# Patient Record
Sex: Male | Born: 1955 | Race: White | Hispanic: No | Marital: Single | Smoking: Current every day smoker
Health system: Southern US, Community
[De-identification: ages and names within clinical notes are randomized; demographics above are authoritative.]

## PROBLEM LIST (undated history)

## (undated) DIAGNOSIS — J439 Emphysema, unspecified: Secondary | ICD-10-CM

## (undated) DIAGNOSIS — J449 Chronic obstructive pulmonary disease, unspecified: Secondary | ICD-10-CM

## (undated) DIAGNOSIS — I319 Disease of pericardium, unspecified: Secondary | ICD-10-CM

## (undated) DIAGNOSIS — F319 Bipolar disorder, unspecified: Secondary | ICD-10-CM

## (undated) DIAGNOSIS — I10 Essential (primary) hypertension: Secondary | ICD-10-CM

## (undated) DIAGNOSIS — F4310 Post-traumatic stress disorder, unspecified: Secondary | ICD-10-CM

## (undated) HISTORY — PX: FRACTURE SURGERY: SHX138

## (undated) HISTORY — PX: OTHER SURGICAL HISTORY: SHX169

## (undated) HISTORY — DX: Chronic obstructive pulmonary disease, unspecified: J44.9

## (undated) HISTORY — DX: Emphysema, unspecified: J43.9

## (undated) HISTORY — DX: Disease of pericardium, unspecified: I31.9

---

## 2013-03-22 ENCOUNTER — Ambulatory Visit: Payer: Self-pay

## 2013-08-06 ENCOUNTER — Ambulatory Visit: Payer: Self-pay

## 2018-04-24 ENCOUNTER — Encounter: Payer: Self-pay | Admitting: Emergency Medicine

## 2018-04-24 ENCOUNTER — Other Ambulatory Visit: Payer: Self-pay

## 2018-04-24 ENCOUNTER — Emergency Department
Admission: EM | Admit: 2018-04-24 | Discharge: 2018-04-24 | Disposition: A | Discharge status: Home / Self Care | Payer: Medicaid Other | Attending: Emergency Medicine | Admitting: Emergency Medicine

## 2018-04-24 ENCOUNTER — Emergency Department: Payer: Medicaid Other

## 2018-04-24 DIAGNOSIS — I10 Essential (primary) hypertension: Secondary | ICD-10-CM | POA: Insufficient documentation

## 2018-04-24 DIAGNOSIS — M75101 Unspecified rotator cuff tear or rupture of right shoulder, not specified as traumatic: Secondary | ICD-10-CM | POA: Insufficient documentation

## 2018-04-24 DIAGNOSIS — Z87891 Personal history of nicotine dependence: Secondary | ICD-10-CM | POA: Insufficient documentation

## 2018-04-24 DIAGNOSIS — M7581 Other shoulder lesions, right shoulder: Secondary | ICD-10-CM

## 2018-04-24 DIAGNOSIS — M25511 Pain in right shoulder: Secondary | ICD-10-CM | POA: Diagnosis present

## 2018-04-24 HISTORY — DX: Bipolar disorder, unspecified: F31.9

## 2018-04-24 HISTORY — DX: Essential (primary) hypertension: I10

## 2018-04-24 HISTORY — DX: Post-traumatic stress disorder, unspecified: F43.10

## 2018-04-24 MED ORDER — HYDROMORPHONE HCL 1 MG/ML IJ SOLN
1.0000 mg | Freq: Once | INTRAMUSCULAR | Status: AC
  Administered 2018-04-24: 1 mg via INTRAMUSCULAR
  Filled 2018-04-24: qty 1

## 2018-04-24 MED ORDER — NAPROXEN 500 MG PO TABS: 500.0000 mg | ORAL_TABLET | Freq: Two times a day (BID) | ORAL | Status: DC

## 2018-04-24 MED ORDER — ORPHENADRINE CITRATE 30 MG/ML IJ SOLN
60.0000 mg | Freq: Two times a day (BID) | INTRAMUSCULAR | Status: DC
  Administered 2018-04-24: 60 mg via INTRAMUSCULAR
  Filled 2018-04-24 (×2): qty 2

## 2018-04-24 MED ORDER — OXYCODONE-ACETAMINOPHEN 7.5-325 MG PO TABS: 1.0000 | ORAL_TABLET | Freq: Four times a day (QID) | ORAL | 0 refills | Status: DC | PRN

## 2018-04-24 NOTE — ED Triage Notes (Signed)
Seen at [facility] for muscle spasm in upper back. Now it is radiating under right arm. It iincreases with movement of arm.  Started 2 week ago.  "constant' pain.
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
## 2018-04-24 NOTE — ED Notes (Signed)
Pt ambulatory to POV without difficulty. VSS, NAD. Discharge instructions, RX and follow up discussed. All questions addressed.

## 2018-04-24 NOTE — Discharge Instructions (Addendum)
Wear arm sling until evaluation by orthopedics. °

## 2018-05-25 ENCOUNTER — Other Ambulatory Visit: Payer: Self-pay | Admitting: Student

## 2018-05-25 DIAGNOSIS — M4722 Other spondylosis with radiculopathy, cervical region: Secondary | ICD-10-CM

## 2018-06-06 ENCOUNTER — Ambulatory Visit
Admission: RE | Admit: 2018-06-06 | Discharge: 2018-06-06 | Disposition: A | Discharge status: Home / Self Care | Payer: Medicaid Other | Source: Ambulatory Visit | Attending: Student | Admitting: Student

## 2018-06-06 DIAGNOSIS — M4802 Spinal stenosis, cervical region: Secondary | ICD-10-CM | POA: Insufficient documentation

## 2018-06-06 DIAGNOSIS — M4722 Other spondylosis with radiculopathy, cervical region: Secondary | ICD-10-CM | POA: Diagnosis present

## 2018-06-06 DIAGNOSIS — M50322 Other cervical disc degeneration at C5-C6 level: Secondary | ICD-10-CM | POA: Insufficient documentation

## 2018-06-27 ENCOUNTER — Other Ambulatory Visit: Payer: Self-pay | Admitting: Family Medicine

## 2018-06-27 DIAGNOSIS — M4722 Other spondylosis with radiculopathy, cervical region: Secondary | ICD-10-CM

## 2018-07-09 ENCOUNTER — Ambulatory Visit
Admission: RE | Admit: 2018-07-09 | Discharge: 2018-07-09 | Disposition: A | Discharge status: Home / Self Care | Payer: Medicaid Other | Source: Ambulatory Visit | Attending: Family Medicine | Admitting: Family Medicine

## 2018-07-09 DIAGNOSIS — M4722 Other spondylosis with radiculopathy, cervical region: Secondary | ICD-10-CM

## 2018-07-09 MED ORDER — TRIAMCINOLONE ACETONIDE 40 MG/ML IJ SUSP (RADIOLOGY)
60.0000 mg | Freq: Once | INTRAMUSCULAR | Status: AC
  Administered 2018-07-09: 60 mg via EPIDURAL

## 2018-07-09 MED ORDER — IOPAMIDOL (ISOVUE-M 200) INJECTION 41%
1.0000 mL | Freq: Once | INTRAMUSCULAR | Status: AC
  Administered 2018-07-09: 1 mL via EPIDURAL

## 2018-07-09 NOTE — Discharge Instructions (Signed)

## 2018-09-07 ENCOUNTER — Other Ambulatory Visit: Payer: Self-pay

## 2018-09-07 DIAGNOSIS — Z86010 Personal history of colonic polyps: Secondary | ICD-10-CM

## 2018-09-07 NOTE — Progress Notes (Signed)
Gastroenterology Pre-Procedure Review  Request Date: 09/24/18 Requesting Physician: Dr. Tobi Bastos  PATIENT REVIEW QUESTIONS: The patient responded to the following health history questions as indicated:    1. Are you having any GI issues? no 2. Do you have a personal history of Polyps? yes (5 years ago 8-10 polyps Colonoscopy was done at the [facility]) 3. Do you have a family history of Colon Cancer or Polyps? yes (Father Colon Cancer) 4. Diabetes Mellitus? no 5. Joint replacements in the past 12 months?no 6. Major health problems in the past 3 months?no 7. Any artificial heart valves, MVP, or defibrillator?no    MEDICATIONS & ALLERGIES:    Patient reports the following regarding taking any anticoagulation/antiplatelet therapy:   Plavix, Coumadin, Eliquis, Xarelto, Lovenox, Pradaxa, Brilinta, or Effient? no Aspirin? no  Patient confirms/reports the following medications:  Current Outpatient Medications  Medication Sig Dispense Refill  . naproxen (NAPROSYN) 500 MG tablet Take 1 tablet (500 mg total) by mouth 2 (two) times daily with a meal. 20 tablet 00  . oxyCODONE-acetaminophen (PERCOCET) 7.5-325 MG tablet Take 1 tablet by mouth every 6 (six) hours as needed for severe pain. 12 tablet 0   No current facility-administered medications for this visit.     Patient confirms/reports the following allergies:  Allergies  Allergen Reactions  . Chlorpromazine Swelling  . Pravastatin Nausea Only    No orders of the defined types were placed in this encounter.   AUTHORIZATION INFORMATION Primary Insurance: 1D#: Group #:  Secondary Insurance: 1D#: Group #:  SCHEDULE INFORMATION: Date: 09/24/18 Time: Location:[facility]

## 2018-09-21 ENCOUNTER — Other Ambulatory Visit: Payer: Self-pay

## 2018-09-21 MED ORDER — PEG 3350-KCL-NA BICARB-NACL 420 G PO SOLR: 4000.0000 mL | Freq: Once | ORAL | 0 refills | Status: AC

## 2018-09-24 ENCOUNTER — Encounter
Admission: RE | Disposition: A | Discharge status: Home / Self Care | Payer: Self-pay | Source: Ambulatory Visit | Attending: Gastroenterology

## 2018-09-24 ENCOUNTER — Ambulatory Visit: Payer: Medicaid Other | Admitting: Anesthesiology

## 2018-09-24 ENCOUNTER — Ambulatory Visit
Admission: RE | Admit: 2018-09-24 | Discharge: 2018-09-24 | Disposition: A | Discharge status: Home / Self Care | Payer: Medicaid Other | Source: Ambulatory Visit | Attending: Gastroenterology | Admitting: Gastroenterology

## 2018-09-24 ENCOUNTER — Encounter: Payer: Self-pay | Admitting: Anesthesiology

## 2018-09-24 DIAGNOSIS — Z1211 Encounter for screening for malignant neoplasm of colon: Secondary | ICD-10-CM | POA: Diagnosis present

## 2018-09-24 DIAGNOSIS — I10 Essential (primary) hypertension: Secondary | ICD-10-CM | POA: Diagnosis not present

## 2018-09-24 DIAGNOSIS — Z79899 Other long term (current) drug therapy: Secondary | ICD-10-CM | POA: Insufficient documentation

## 2018-09-24 DIAGNOSIS — F319 Bipolar disorder, unspecified: Secondary | ICD-10-CM | POA: Diagnosis not present

## 2018-09-24 DIAGNOSIS — Z86010 Personal history of colonic polyps: Secondary | ICD-10-CM | POA: Insufficient documentation

## 2018-09-24 DIAGNOSIS — K5730 Diverticulosis of large intestine without perforation or abscess without bleeding: Secondary | ICD-10-CM | POA: Insufficient documentation

## 2018-09-24 DIAGNOSIS — Z87891 Personal history of nicotine dependence: Secondary | ICD-10-CM | POA: Diagnosis not present

## 2018-09-24 DIAGNOSIS — F4310 Post-traumatic stress disorder, unspecified: Secondary | ICD-10-CM | POA: Insufficient documentation

## 2018-09-24 HISTORY — PX: COLONOSCOPY WITH PROPOFOL: SHX5780

## 2018-09-24 SURGERY — COLONOSCOPY WITH PROPOFOL
Anesthesia: General

## 2018-09-24 MED ORDER — EPINEPHRINE PF 1 MG/ML IJ SOLN
INTRAMUSCULAR | Status: AC
  Filled 2018-09-24: qty 1

## 2018-09-24 MED ORDER — PROPOFOL 500 MG/50ML IV EMUL
INTRAVENOUS | Status: AC
  Filled 2018-09-24: qty 50

## 2018-09-24 MED ORDER — PROPOFOL 500 MG/50ML IV EMUL
INTRAVENOUS | Status: DC | PRN
  Administered 2018-09-24: 140 ug/kg/min via INTRAVENOUS

## 2018-09-24 MED ORDER — PROPOFOL 10 MG/ML IV BOLUS
INTRAVENOUS | Status: DC | PRN
  Administered 2018-09-24: 100 mg via INTRAVENOUS

## 2018-09-24 MED ORDER — SODIUM CHLORIDE 0.9 % IV SOLN
INTRAVENOUS | Status: DC
  Administered 2018-09-24: 1000 mL via INTRAVENOUS

## 2018-09-24 MED ORDER — PHENYLEPHRINE HCL 10 MG/ML IJ SOLN
INTRAMUSCULAR | Status: AC
  Filled 2018-09-24: qty 1
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
## 2018-09-24 NOTE — Anesthesia Post-op Follow-up Note (Signed)
Anesthesia QCDR form completed.        

## 2018-09-24 NOTE — Op Note (Signed)
[facility] Gastroenterology Patient Name: Randy Freeman Procedure Date: 09/24/2018 7:36 AM MRN: [id number] Account #: [id number] Date of Birth: Dec 09, 1956 Admit Type: Outpatient Age: 62 Room: [facility] ENDO ROOM 4 Gender: Male Note Status: Finalized Procedure:            Colonoscopy Indications:          High risk colon cancer surveillance: Personal history                        of colonic polyps Providers:            Wyline Mood MD, MD Referring MD:         [facility], MD (Referring MD) Medicines:            Monitored Anesthesia Care Complications:        No immediate complications. Procedure:            Pre-Anesthesia Assessment:                       - Prior to the procedure, a History and Physical was                        performed, and patient medications, allergies and                        sensitivities were reviewed. The patient's tolerance of                        previous anesthesia was reviewed.                       - The risks and benefits of the procedure and the                        sedation options and risks were discussed with the                        patient. All questions were answered and informed                        consent was obtained.                       - ASA Grade Assessment: II - A patient with mild                        systemic disease.                       After obtaining informed consent, the colonoscope was                        passed under direct vision. Throughout the procedure,                        the patient's blood pressure, pulse, and oxygen                        saturations were monitored continuously. The  Colonoscope was introduced through the anus and                        advanced to the the cecum, identified by the                        appendiceal orifice, IC valve and transillumination.                        The colonoscopy was performed with ease.  The patient                        tolerated the procedure well. The quality of the bowel                        preparation was good. Findings:      The perianal and digital rectal examinations were normal.      Multiple small-mouthed diverticula were found in the entire colon.      The entire examined colon appeared normal on direct and retroflexion       views. Impression:           - Diverticulosis in the entire examined colon.                       - The entire examined colon is normal on direct and                        retroflexion views.                       - No specimens collected. Recommendation:       - Discharge patient to home (with escort).                       - Resume previous diet.                       - Continue present medications.                       - Repeat colonoscopy in 5 years for surveillance. Procedure Code(s):    --- Professional ---                       [phone], Colonoscopy, flexible; diagnostic, including                        collection of specimen(s) by brushing or washing, when                        performed (separate procedure) Diagnosis Code(s):    --- Professional ---                       Z86.010, Personal history of colonic polyps                       K57.30, Diverticulosis of large intestine without                        perforation or abscess without bleeding CPT copyright 2018 American Medical Association. All rights reserved. The codes documented  in this report are preliminary and upon coder review may  be revised to meet current compliance requirements. Wyline Mood, MD Wyline Mood MD, MD 09/24/2018 8:24:39 AM This report has been signed electronically. Number of Addenda: 0 Note Initiated On: 09/24/2018 7:36 AM Scope Withdrawal Time: 0 hours 8 minutes 45 seconds  Total Procedure Duration: 0 hours 10 minutes 7 seconds       [facility]

## 2018-09-24 NOTE — Anesthesia Postprocedure Evaluation (Signed)
Anesthesia Post Note  Patient: Randy Freeman  Procedure(s) Performed: COLONOSCOPY WITH PROPOFOL (N/A )  Patient location during evaluation: Endoscopy Anesthesia Type: General Level of consciousness: awake and alert Pain management: pain level controlled Vital Signs Assessment: post-procedure vital signs reviewed and stable Respiratory status: spontaneous breathing, nonlabored ventilation, respiratory function stable and patient connected to nasal cannula oxygen Cardiovascular status: blood pressure returned to baseline and stable Postop Assessment: no apparent nausea or vomiting Anesthetic complications: no     Last Vitals:  Vitals:   09/24/18 0835 09/24/18 0845  BP: 136/75 118/87  Pulse: 76 80  Resp: 12 12  Temp:    SpO2: 100% 100%    Last Pain:  Vitals:   09/24/18 0845  TempSrc:   PainSc: 0-No pain                 Cleda Mccreedy Everly Rubalcava

## 2018-09-24 NOTE — Transfer of Care (Signed)
Immediate Anesthesia Transfer of Care Note  Patient: Randy Freeman  Procedure(s) Performed: COLONOSCOPY WITH PROPOFOL (N/A )  Patient Location: Endoscopy Unit  Anesthesia Type:General  Level of Consciousness: awake  Airway & Oxygen Therapy: Patient Spontanous Breathing and Patient connected to nasal cannula oxygen  Post-op Assessment: Report given to RN and Post -op Vital signs reviewed and stable  Post vital signs: Reviewed and stable  Last Vitals:  Vitals Value Taken Time  BP    Temp    Pulse    Resp    SpO2 99 % 09/24/2018  8:25 AM    Last Pain:  Vitals:   09/24/18 0706  TempSrc: Tympanic  PainSc: 2          Complications: No apparent anesthesia complications

## 2019-01-28 ENCOUNTER — Telehealth: Payer: Self-pay | Admitting: *Deleted

## 2019-01-28 DIAGNOSIS — Z87891 Personal history of nicotine dependence: Secondary | ICD-10-CM

## 2019-01-28 DIAGNOSIS — Z122 Encounter for screening for malignant neoplasm of respiratory organs: Secondary | ICD-10-CM

## 2019-01-28 NOTE — Telephone Encounter (Signed)
Received referral for initial lung cancer screening scan. Contacted patient and obtained smoking history,(current, 96 pack year) as well as answering questions related to screening process. Patient denies signs of lung cancer such as weight loss or hemoptysis. Patient denies comorbidity that would prevent curative treatment if lung cancer were found. Patient is scheduled for shared decision making visit and CT scan on 02/12/19 at 215pm.

## 2019-02-11 ENCOUNTER — Telehealth: Payer: Self-pay | Admitting: *Deleted

## 2019-02-11 NOTE — Telephone Encounter (Signed)
Called pt to remind him of his appt for ldct screening on 02-12-2019@1415 , message left for pt.

## 2019-02-12 ENCOUNTER — Ambulatory Visit
Admission: RE | Admit: 2019-02-12 | Discharge: 2019-02-12 | Disposition: A | Discharge status: Home / Self Care | Payer: Medicaid Other | Source: Ambulatory Visit | Attending: Oncology | Admitting: Oncology

## 2019-02-12 ENCOUNTER — Encounter: Payer: Self-pay | Admitting: Oncology

## 2019-02-12 ENCOUNTER — Inpatient Hospital Stay: Payer: Medicaid Other | Attending: Oncology | Admitting: Oncology

## 2019-02-12 ENCOUNTER — Encounter (INDEPENDENT_AMBULATORY_CARE_PROVIDER_SITE_OTHER): Payer: Self-pay

## 2019-02-12 ENCOUNTER — Other Ambulatory Visit: Payer: Self-pay

## 2019-02-12 DIAGNOSIS — Z87891 Personal history of nicotine dependence: Secondary | ICD-10-CM

## 2019-02-12 DIAGNOSIS — F17210 Nicotine dependence, cigarettes, uncomplicated: Secondary | ICD-10-CM | POA: Diagnosis not present

## 2019-02-12 DIAGNOSIS — Z122 Encounter for screening for malignant neoplasm of respiratory organs: Secondary | ICD-10-CM | POA: Insufficient documentation

## 2019-02-12 NOTE — Progress Notes (Signed)
In accordance with CMS guidelines, patient has met eligibility criteria including age, absence of signs or symptoms of lung cancer.  Social History   Tobacco Use  . Smoking status: Current Every Day Smoker    Packs/day: 2.00    Years: 48.00    Pack years: 96.00    Types: Cigarettes  . Smokeless tobacco: Never Used  Substance Use Topics  . Alcohol use: Not Currently    Frequency: Never  . Drug use: Not on file     A shared decision-making session was conducted prior to the performance of CT scan. This includes one or more decision aids, includes benefits and harms of screening, follow-up diagnostic testing, over-diagnosis, false positive rate, and total radiation exposure.  Counseling on the importance of adherence to annual lung cancer LDCT screening, impact of co-morbidities, and ability or willingness to undergo diagnosis and treatment is imperative for compliance of the program.  Counseling on the importance of continued smoking cessation for former smokers; the importance of smoking cessation for current smokers, and information about tobacco cessation interventions have been given to patient including [place] and [employer] quit [place] programs.  Written order for lung cancer screening with LDCT has been given to the patient and any and all questions have been answered to the best of my abilities.   Yearly follow up will be coordinated by Burgess Estelle, Thoracic Navigator.  Faythe Casa, NP 02/12/2019 2:52 PM

## 2019-02-18 ENCOUNTER — Encounter: Payer: Self-pay | Admitting: *Deleted

## 2019-02-19 ENCOUNTER — Encounter: Payer: Self-pay | Admitting: *Deleted

## 2019-02-27 ENCOUNTER — Encounter: Payer: Self-pay | Admitting: *Deleted

## 2019-07-23 ENCOUNTER — Emergency Department: Payer: Medicaid Other

## 2019-07-23 ENCOUNTER — Encounter: Payer: Self-pay | Admitting: Emergency Medicine

## 2019-07-23 ENCOUNTER — Emergency Department
Admission: EM | Admit: 2019-07-23 | Discharge: 2019-07-23 | Disposition: A | Discharge status: Other Acute Inpatient Hospital | Payer: Medicaid Other | Attending: Emergency Medicine | Admitting: Emergency Medicine

## 2019-07-23 ENCOUNTER — Other Ambulatory Visit: Payer: Self-pay

## 2019-07-23 DIAGNOSIS — S4991XA Unspecified injury of right shoulder and upper arm, initial encounter: Secondary | ICD-10-CM | POA: Diagnosis present

## 2019-07-23 DIAGNOSIS — Y93I9 Activity, other involving external motion: Secondary | ICD-10-CM | POA: Insufficient documentation

## 2019-07-23 DIAGNOSIS — S42101A Fracture of unspecified part of scapula, right shoulder, initial encounter for closed fracture: Secondary | ICD-10-CM

## 2019-07-23 DIAGNOSIS — F17210 Nicotine dependence, cigarettes, uncomplicated: Secondary | ICD-10-CM | POA: Insufficient documentation

## 2019-07-23 DIAGNOSIS — I10 Essential (primary) hypertension: Secondary | ICD-10-CM | POA: Diagnosis not present

## 2019-07-23 DIAGNOSIS — Y998 Other external cause status: Secondary | ICD-10-CM | POA: Insufficient documentation

## 2019-07-23 DIAGNOSIS — M25519 Pain in unspecified shoulder: Secondary | ICD-10-CM

## 2019-07-23 DIAGNOSIS — Y92410 Unspecified street and highway as the place of occurrence of the external cause: Secondary | ICD-10-CM | POA: Diagnosis not present

## 2019-07-23 DIAGNOSIS — S42001A Fracture of unspecified part of right clavicle, initial encounter for closed fracture: Secondary | ICD-10-CM

## 2019-07-23 DIAGNOSIS — S2241XA Multiple fractures of ribs, right side, initial encounter for closed fracture: Secondary | ICD-10-CM | POA: Diagnosis not present

## 2019-07-23 LAB — CBC WITH DIFFERENTIAL/PLATELET
Abs Immature Granulocytes: 0.17 10*3/uL — ABNORMAL HIGH (ref 0.00–0.07)
Basophils Absolute: 0.1 10*3/uL (ref 0.0–0.1)
Basophils Relative: 0 %
Eosinophils Absolute: 0 10*3/uL (ref 0.0–0.5)
Eosinophils Relative: 0 %
HCT: 43.7 % (ref 39.0–52.0)
Hemoglobin: 15.9 g/dL (ref 13.0–17.0)
Immature Granulocytes: 1 %
Lymphocytes Relative: 7 %
Lymphs Abs: 1.7 10*3/uL (ref 0.7–4.0)
MCH: 32.4 pg (ref 26.0–34.0)
MCHC: 36.4 g/dL — ABNORMAL HIGH (ref 30.0–36.0)
MCV: 89 fL (ref 80.0–100.0)
Monocytes Absolute: 1.7 10*3/uL — ABNORMAL HIGH (ref 0.1–1.0)
Monocytes Relative: 7 %
Neutro Abs: 20.4 10*3/uL — ABNORMAL HIGH (ref 1.7–7.7)
Neutrophils Relative %: 85 %
Platelets: 429 10*3/uL — ABNORMAL HIGH (ref 150–400)
RBC: 4.91 MIL/uL (ref 4.22–5.81)
RDW: 12.5 % (ref 11.5–15.5)
WBC: 24 10*3/uL — ABNORMAL HIGH (ref 4.0–10.5)
nRBC: 0 % (ref 0.0–0.2)

## 2019-07-23 LAB — BASIC METABOLIC PANEL
Anion gap: 11 (ref 5–15)
BUN: 15 mg/dL (ref 8–23)
CO2: 29 mmol/L (ref 22–32)
Calcium: 9.4 mg/dL (ref 8.9–10.3)
Chloride: 95 mmol/L — ABNORMAL LOW (ref 98–111)
Creatinine, Ser: 0.96 mg/dL (ref 0.61–1.24)
GFR calc Af Amer: >60 mL/min (ref >60)
GFR calc non Af Amer: >60 mL/min (ref >60)
Glucose, Bld: 125 mg/dL — ABNORMAL HIGH (ref 70–99)
Potassium: 3.2 mmol/L — ABNORMAL LOW (ref 3.5–5.1)
Sodium: 135 mmol/L (ref 135–145)

## 2019-07-23 MED ORDER — MORPHINE SULFATE (PF) 4 MG/ML IV SOLN
4.0000 mg | Freq: Once | INTRAVENOUS | Status: AC
  Administered 2019-07-23: 4 mg via INTRAVENOUS
  Filled 2019-07-23: qty 1

## 2019-07-23 MED ORDER — HYDROMORPHONE HCL 1 MG/ML IJ SOLN
1.0000 mg | Freq: Once | INTRAMUSCULAR | Status: AC
  Administered 2019-07-23: 1 mg via INTRAVENOUS
  Filled 2019-07-23: qty 1

## 2019-07-23 MED ORDER — HYDROMORPHONE HCL 1 MG/ML PO LIQD: 1.0000 mg | Freq: Once | ORAL | Status: DC
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
## 2019-07-23 NOTE — ED Notes (Signed)
Family at bedside. MD at bedside to update.

## 2019-07-23 NOTE — ED Notes (Signed)
Patient transported to CT 
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
## 2019-09-09 ENCOUNTER — Other Ambulatory Visit: Payer: Self-pay | Admitting: Specialist

## 2019-09-09 ENCOUNTER — Other Ambulatory Visit (HOSPITAL_COMMUNITY): Payer: Self-pay | Admitting: Specialist

## 2019-09-09 DIAGNOSIS — R590 Localized enlarged lymph nodes: Secondary | ICD-10-CM

## 2019-09-24 ENCOUNTER — Ambulatory Visit: Admission: RE | Admit: 2019-09-24 | Payer: Medicaid Other | Source: Ambulatory Visit

## 2019-10-01 ENCOUNTER — Ambulatory Visit
Admission: RE | Admit: 2019-10-01 | Discharge: 2019-10-01 | Disposition: A | Discharge status: Home / Self Care | Payer: Medicaid Other | Source: Ambulatory Visit | Attending: Specialist | Admitting: Specialist

## 2019-10-01 ENCOUNTER — Other Ambulatory Visit: Payer: Self-pay

## 2019-10-01 ENCOUNTER — Encounter (INDEPENDENT_AMBULATORY_CARE_PROVIDER_SITE_OTHER): Payer: Self-pay

## 2019-10-01 DIAGNOSIS — R590 Localized enlarged lymph nodes: Secondary | ICD-10-CM | POA: Diagnosis present

## 2020-09-16 ENCOUNTER — Other Ambulatory Visit: Payer: Self-pay | Admitting: Specialist

## 2020-09-16 DIAGNOSIS — R590 Localized enlarged lymph nodes: Secondary | ICD-10-CM

## 2020-09-29 ENCOUNTER — Ambulatory Visit: Payer: Medicaid Other

## 2020-10-02 ENCOUNTER — Telehealth: Payer: Self-pay

## 2020-10-02 DIAGNOSIS — Z122 Encounter for screening for malignant neoplasm of respiratory organs: Secondary | ICD-10-CM

## 2020-10-02 DIAGNOSIS — Z87891 Personal history of nicotine dependence: Secondary | ICD-10-CM

## 2020-10-02 NOTE — Telephone Encounter (Signed)
Attempted to call patient to schedule lung screening CT and call went straight to voicemail. Left message with phone number for pt to return call and schedule CT.

## 2020-10-08 NOTE — Telephone Encounter (Signed)
Contacted and scheduled. Current smoker, 97 pack year

## 2020-10-08 NOTE — Addendum Note (Signed)
Addended by: Jonne Ply on: 10/08/2020 10:26 AM   Modules accepted: Orders

## 2020-10-14 ENCOUNTER — Ambulatory Visit
Admission: RE | Admit: 2020-10-14 | Discharge: 2020-10-14 | Disposition: A | Discharge status: Home / Self Care | Payer: Medicaid Other | Source: Ambulatory Visit | Attending: Nurse Practitioner | Admitting: Nurse Practitioner

## 2020-10-14 ENCOUNTER — Other Ambulatory Visit: Payer: Self-pay

## 2020-10-14 DIAGNOSIS — Z87891 Personal history of nicotine dependence: Secondary | ICD-10-CM | POA: Diagnosis present

## 2020-10-14 DIAGNOSIS — Z122 Encounter for screening for malignant neoplasm of respiratory organs: Secondary | ICD-10-CM

## 2020-10-20 ENCOUNTER — Telehealth: Payer: Self-pay | Admitting: *Deleted

## 2020-10-20 NOTE — Telephone Encounter (Signed)
Voicemail left 10/19/20 and 10/20/20 in attempt to review lung screening scan results. Will send letter to notify if patient does not return call.

## 2020-10-21 ENCOUNTER — Encounter: Payer: Self-pay | Admitting: *Deleted

## 2020-10-21 NOTE — Progress Notes (Signed)
IMPRESSION: 1. Lung-RADS 3, probably benign findings. New area of ground-glass opacity in the anterior left upper lobe, potentially infectious/inflammatory. Short-term follow-up in 6 months is recommended with repeat low-dose chest CT without contrast (please use the following order, "CT CHEST LCS NODULE FOLLOW-UP W/O CM"). 2. Hepatic steatosis. 3. Aortic Atherosclerosis (ICD10-I70.0) and Emphysema (ICD10-

## 2021-03-31 ENCOUNTER — Encounter: Payer: Self-pay | Admitting: *Deleted

## 2021-03-31 ENCOUNTER — Telehealth: Payer: Self-pay | Admitting: *Deleted

## 2021-03-31 DIAGNOSIS — R918 Other nonspecific abnormal finding of lung field: Secondary | ICD-10-CM

## 2021-03-31 DIAGNOSIS — Z87891 Personal history of nicotine dependence: Secondary | ICD-10-CM

## 2021-03-31 NOTE — Telephone Encounter (Signed)
Spoke to patient via telephone re: scheduling his 6 month f/u lung scan. Confirmed smoking history, current every day smoker, 2 ppd x 48 years. Scan scheduled on 04/13/21 @ 4:30 pm.

## 2021-04-13 ENCOUNTER — Ambulatory Visit
Admission: RE | Admit: 2021-04-13 | Discharge: 2021-04-13 | Disposition: A | Discharge status: Home / Self Care | Payer: Medicaid Other | Source: Ambulatory Visit | Attending: Nurse Practitioner | Admitting: Nurse Practitioner

## 2021-04-13 ENCOUNTER — Other Ambulatory Visit: Payer: Self-pay

## 2021-04-13 DIAGNOSIS — Z87891 Personal history of nicotine dependence: Secondary | ICD-10-CM | POA: Insufficient documentation

## 2021-04-13 DIAGNOSIS — R918 Other nonspecific abnormal finding of lung field: Secondary | ICD-10-CM | POA: Insufficient documentation

## 2021-04-20 ENCOUNTER — Encounter: Payer: Self-pay | Admitting: *Deleted

## 2021-06-29 ENCOUNTER — Other Ambulatory Visit: Payer: Self-pay

## 2021-06-29 ENCOUNTER — Ambulatory Visit (INDEPENDENT_AMBULATORY_CARE_PROVIDER_SITE_OTHER): Payer: Medicaid Other | Admitting: Cardiovascular Disease

## 2021-06-29 ENCOUNTER — Encounter: Payer: Self-pay | Admitting: Cardiovascular Disease

## 2021-06-29 VITALS — BP 136/68 | HR 105 | Ht 72.0 in | Wt 242.4 lb

## 2021-06-29 DIAGNOSIS — I739 Peripheral vascular disease, unspecified: Secondary | ICD-10-CM | POA: Diagnosis not present

## 2021-06-29 DIAGNOSIS — R079 Chest pain, unspecified: Secondary | ICD-10-CM | POA: Diagnosis not present

## 2021-06-29 DIAGNOSIS — R0600 Dyspnea, unspecified: Secondary | ICD-10-CM | POA: Diagnosis not present

## 2021-06-29 DIAGNOSIS — I10 Essential (primary) hypertension: Secondary | ICD-10-CM

## 2021-06-29 DIAGNOSIS — Z720 Tobacco use: Secondary | ICD-10-CM | POA: Diagnosis not present

## 2021-06-29 NOTE — Patient Instructions (Signed)
Medication Instructions:  Your physician recommends that you continue on your current medications as directed. Please refer to the Current Medication list given to you today.  *If you need a refill on your cardiac medications before your next appointment, please call your pharmacy*   Lab Work: None ordered  If you have labs (blood work) drawn today and your tests are completely normal, you will receive your results only by: MyChart Message (if you have MyChart) OR A paper copy in the mail If you have any lab test that is abnormal or we need to change your treatment, we will call you to review the results.   Testing/Procedures: Your physician has requested that you have an echocardiogram. Echocardiography is a painless test that uses sound waves to create images of your heart. It provides your doctor with information about the size and shape of your heart and how well your heart's chambers and valves are working. This procedure takes approximately one hour. There are no restrictions for this procedure.  Your physician has requested that you have a lower extremity segmental duplex. During this test, ultrasound are used to evaluate arterial blood flow in the legs. Allow one hour for this exam. There are no restrictions or special instructions.  Your physician has requested that you have a lexiscan myoview. For further information please visit [email or web address] Please follow instruction sheet, as given.    Follow-Up: At [facility], you and your health needs are our priority.  As part of our continuing mission to provide you with exceptional heart care, we have created designated Provider Care Teams.  These Care Teams include your primary Cardiologist (physician) and Advanced Practice Providers (APPs -  Physician Assistants and Nurse Practitioners) who all work together to provide you with the care you need, when you need it.  We recommend signing up for the patient portal called  "MyChart".  Sign up information is provided on this After Visit Summary.  MyChart is used to connect with patients for Virtual Visits (Telemedicine).  Patients are able to view lab/test results, encounter notes, upcoming appointments, etc.  Non-urgent messages can be sent to your provider as well.   To learn more about what you can do with MyChart, go to [email or web address].    Your next appointment:   Follow up after testing  The format for your next appointment:   In Person  Provider:   You may see Lorine Bears, MD or one of the following Advanced Practice Providers on your designated Care Team:   Nicolasa Ducking, NP Eula Listen, PA-C Marisue Ivan, PA-C Cadence Fransico Michael, [place]   Other Instructions  [facility] MYOVIEW  Your caregiver has ordered a Stress Test with nuclear imaging. The purpose of this test is to evaluate the blood supply to your heart muscle. This procedure is referred to as a "Non-Invasive Stress Test." This is because other than having an IV started in your vein, nothing is inserted or "invades" your body. Cardiac stress tests are done to find areas of poor blood flow to the heart by determining the extent of coronary artery disease (CAD). Some patients exercise on a treadmill, which naturally increases the blood flow to your heart, while others who are  unable to walk on a treadmill due to physical limitations have a pharmacologic/chemical stress agent called Lexiscan . This medicine will mimic walking on a treadmill by temporarily increasing your coronary blood flow.   Please note: these test may take anywhere between 2-4 hours to complete  PLEASE REPORT TO  [facility]TR[facility]  THE VOLUNTEERS AT THE FIRST DESK WILL DIRECT YOU WHERE TO GO  Date of Procedure:_____________________________________  Arrival Time for Procedure:______________________________   PLEASE NOTIFY THE OFFICE AT LEAST 24 HOURS IN ADVANCE IF YOU ARE UNABLE TO KEEP YOUR APPOINTMENT.   [phone] AND  PLEASE NOTIFY NUCLEAR MEDICINE AT [facility] AT LEAST 24 HOURS IN ADVANCE IF YOU ARE UNABLE TO KEEP YOUR APPOINTMENT. [phone]  How to prepare for your Myoview test:  Do not eat or drink after midnight No caffeine for 24 hours prior to test No smoking 24 hours prior to test. Your medication may be taken with water.   Please wear a short sleeve shirt. No cologne or lotion. Wear comfortable walking shoes. No heels!
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
## 2021-07-05 ENCOUNTER — Other Ambulatory Visit: Payer: Self-pay

## 2021-07-05 ENCOUNTER — Encounter
Admission: RE | Admit: 2021-07-05 | Discharge: 2021-07-05 | Disposition: A | Discharge status: Home / Self Care | Payer: Medicaid Other | Source: Ambulatory Visit | Attending: Cardiovascular Disease | Admitting: Cardiovascular Disease

## 2021-07-05 DIAGNOSIS — R079 Chest pain, unspecified: Secondary | ICD-10-CM | POA: Insufficient documentation

## 2021-07-05 MED ORDER — TECHNETIUM TC 99M TETROFOSMIN IV KIT
10.0000 | PACK | Freq: Once | INTRAVENOUS | Status: AC | PRN
  Administered 2021-07-05: 10.34 via INTRAVENOUS

## 2021-07-05 MED ORDER — REGADENOSON 0.4 MG/5ML IV SOLN
0.4000 mg | Freq: Once | INTRAVENOUS | Status: AC
  Administered 2021-07-05: 0.4 mg via INTRAVENOUS

## 2021-07-05 MED ORDER — TECHNETIUM TC 99M TETROFOSMIN IV KIT
30.6400 | PACK | Freq: Once | INTRAVENOUS | Status: AC | PRN
  Administered 2021-07-05: 30.64 via INTRAVENOUS

## 2021-07-06 LAB — NM MYOCAR MULTI W/SPECT W/WALL MOTION / EF
LV dias vol: 54 mL (ref 62–150)
LV sys vol: 22 mL
Peak HR: 100 {beats}/min
Percent HR: 63 %
Rest HR: 82 {beats}/min
TID: 1

## 2021-08-13 ENCOUNTER — Other Ambulatory Visit: Payer: Self-pay | Admitting: Cardiovascular Disease

## 2021-08-13 ENCOUNTER — Other Ambulatory Visit: Payer: Medicaid Other

## 2021-08-13 DIAGNOSIS — I739 Peripheral vascular disease, unspecified: Secondary | ICD-10-CM

## 2021-08-27 ENCOUNTER — Ambulatory Visit: Payer: Medicaid Other | Admitting: Cardiovascular Disease

## 2021-09-16 ENCOUNTER — Other Ambulatory Visit: Payer: Self-pay

## 2021-09-16 ENCOUNTER — Ambulatory Visit (INDEPENDENT_AMBULATORY_CARE_PROVIDER_SITE_OTHER): Payer: Medicaid Other

## 2021-09-16 DIAGNOSIS — R0600 Dyspnea, unspecified: Secondary | ICD-10-CM | POA: Diagnosis not present

## 2021-09-16 DIAGNOSIS — I739 Peripheral vascular disease, unspecified: Secondary | ICD-10-CM | POA: Diagnosis not present

## 2021-09-16 LAB — ECHOCARDIOGRAM COMPLETE
AR max vel: 4.16 cm2
AV Area VTI: 4.04 cm2
AV Area mean vel: 3.95 cm2
AV Mean grad: 4 mmHg
AV Peak grad: 6.3 mmHg
Ao pk vel: 1.25 m/s
Area-P 1/2: 3.23 cm2

## 2021-09-16 MED ORDER — PERFLUTREN LIPID MICROSPHERE
1.0000 mL | INTRAVENOUS | Status: AC | PRN
  Administered 2021-09-16: 2 mL via INTRAVENOUS

## 2021-09-16 NOTE — H&P (View-Only) (Signed)
Cardiology Office Note:    Date:  09/17/2021   ID:  Randy Freeman, DOB [birth year], MRN [id number]  PCP:  Abram Sander, MD  [facility] Cardiologist:  None  [facility] Electrophysiologist:  None   Referring MD: Abram Sander, MD   Chief Complaint: Testing follow-up  History of Present Illness:    Randy Freeman is a 65 y.o. male with a hx of pericarditis with pericardial effusion status post pericardiocentesis more than 5 years ago, hypertension, hyperlipidemia, obesity, tobacco use, COPD, OSA on CPAP.  Seen by Dr. Rhae Lerner in 2017 for hypertension and dyspnea.  An echo at that time that showed normal LV function with mild LVH and mild pulmonary hypertension.  Seen by Dr. [place] Sink 06/29/2021 for chest pain and dyspnea follow-up.  Lexiscan Myoview and echo was ordered.  Claudication symptoms bilateral ABIs were ordered.  Myoview Lexiscan showing EF 55 to 65%, no significant ischemia or scar, coronary artery calcification and aortic atherosclerosis on CT attenuation correction, low risk study.  Echo showed LVEF 60 to 65%, no wall motion abnormality, grade 1 diastolic dysfunction, borderline dilation of the aortic root measuring 36 mm.  Bilateral ABIs 0.92 right, 0.92 left.  Imaging showed mild right lower extremity arterial disease, mild left lower extremity arterial disease.  Also noted left possible occlusion in the peroneal artery.  Today, the patient reports he has been doing the same. He has intermittent shortness of breath, this is unchanged. NO chest pain. Has occasional sharp pains. Has sharp pains on exertion, similar to that pain. BP elevated today. No LLE, orthopnea, pnd.He uses his CPAP.  He has claudication symptoms. Feels more like fatigue. He smokes, not going quit. Drinks 4 beers a month.   Imdur Aspirin    Past Medical History:  Diagnosis Date   Bipolar 1 disorder (HCC)    COPD (chronic obstructive pulmonary disease) (HCC)    Emphysema lung (HCC)     Hypertension    Pericarditis    PTSD (post-traumatic stress disorder)     Past Surgical History:  Procedure Laterality Date   COLONOSCOPY WITH PROPOFOL N/A 09/24/2018   Procedure: COLONOSCOPY WITH PROPOFOL;  Surgeon: Wyline Mood, MD;  Location: [facility] ENDOSCOPY;  Service: Gastroenterology;  Laterality: N/A;   FRACTURE SURGERY     OTHER SURGICAL HISTORY     accident.  injured spleen, bladder, fx bilat ankles.    Current Medications: Current Meds  Medication Sig   amLODipine (NORVASC) 10 MG tablet Take 10 mg by mouth every evening.   cetirizine (ZYRTEC) 10 MG tablet Take 10 mg by mouth daily.   citalopram (CELEXA) 20 MG tablet Take 20 mg by mouth daily.   fluticasone (FLONASE) 50 MCG/ACT nasal spray Place 1 spray into both nostrils daily.   Fluticasone-Salmeterol (ADVAIR) 250-50 MCG/DOSE AEPB Inhale 1 puff into the lungs every 12 (twelve) hours.   lamoTRIgine (LAMICTAL) 100 MG tablet Take 200 mg by mouth daily.   losartan (COZAAR) 25 MG tablet Take 25 mg by mouth daily.   Multiple Vitamin (MULTIVITAMIN WITH MINERALS) TABS tablet Take 1 tablet by mouth daily.   pantoprazole (PROTONIX) 40 MG tablet Take 40 mg by mouth 2 (two) times daily.    prazosin (MINIPRESS) 2 MG capsule Take 2 mg by mouth at bedtime.    tiotropium (SPIRIVA) 18 MCG inhalation capsule Place 18 mcg into inhaler and inhale daily.     Allergies:   Chlorpromazine and Pravastatin   Social History   Socioeconomic History   Marital  status: Single    Spouse name: Not on file   Number of children: Not on file   Years of education: Not on file   Highest education level: Not on file  Occupational History   Not on file  Tobacco Use   Smoking status: Every Day    Packs/day: 2.00    Years: 48.00    Pack years: 96.00    Types: Cigarettes   Smokeless tobacco: Never   Tobacco comments:    is trying to cut back  Vaping Use   Vaping Use: Never used  Substance and Sexual Activity   Alcohol use: Yes    Comment:  occassional   Drug use: Yes    Types: Marijuana   Sexual activity: Not on file  Other Topics Concern   Not on file  Social History Narrative   Not on file   Social Determinants of Health   Financial Resource Strain: Not on file  Food Insecurity: Not on file  Transportation Needs: Not on file  Physical Activity: Not on file  Stress: Not on file  Social Connections: Not on file     Family History: The patient's family history includes Heart disease in his father.  ROS:   Please see the history of present illness.     All other systems reviewed and are negative.  EKGs/Labs/Other Studies Reviewed:    The following studies were reviewed today:  Echo 1. Left ventricular ejection fraction, by estimation, is 60 to 65%. The  left ventricle has normal function. The left ventricle has no regional  wall motion abnormalities. Left ventricular diastolic parameters are  consistent with Grade I diastolic  dysfunction (impaired relaxation).   2. Right ventricular systolic function is normal. The right ventricular  size is normal. Tricuspid regurgitation signal is inadequate for assessing  PA pressure.   3. The mitral valve is normal in structure. No evidence of mitral valve  regurgitation. No evidence of mitral stenosis.   4. The aortic valve is normal in structure. Aortic valve regurgitation is  not visualized. No aortic stenosis is present.   5. There is borderline dilatation of the aortic root, measuring 36 mm.  There is dilatation of the ascending aorta,35 mm.   Myoview Lexiscan Narrative & Impression  Normal pharmacologic myocardial perfusion stress test without significant ischemia or scar. The left ventricular ejection fraction is normal (55-65%). Coronary artery calcification and aortic atherosclerosis are noted on the attenuation correction CT, as well as hepatic steatosis. This is a low risk study.    Vas [place] ABI  Current ABI: 0.92 right, 0.92 left  Summary:  Left:  Possible occlusion noted in the peroneal artery.  US duplex Summary:  Right: Resting right ankle-brachial index indicates mild right lower  extremity arterial disease. The right toe-brachial index is normal.   Left: Resting left ankle-brachial index indicates mild left lower  extremity arterial disease. The left toe-brachial index is normal.   EKG:  EKG is  ordered today.  The ekg ordered today demonstrates NSR, 99bpm, nonspecific ST changes  Recent Labs: No results found for requested labs within last 8760 hours.  Recent Lipid Panel No results found for: CHOL, TRIG, HDL, CHOLHDL, VLDL, LDLCALC, LDLDIRECT   Physical Exam:    VS:  BP (!) 152/60 (BP Location: Left Arm, Patient Position: Sitting, Cuff Size: Normal)   Ht 6' (1.829 m)   Wt 235 lb 6 oz (106.8 kg)   SpO2 98%   BMI 31.92 kg/m  Wt Readings from Last 3 Encounters:  09/17/21 235 lb 6 oz (106.8 kg)  06/29/21 242 lb 6 oz (109.9 kg)  10/14/20 230 lb (104.3 kg)     GEN:  Well nourished, well developed in no acute distress HEENT: Normal NECK: No JVD; No carotid bruits LYMPHATICS: No lymphadenopathy CARDIAC: RRR, no murmurs, rubs, gallops RESPIRATORY:  Clear to auscultation without rales, wheezing or rhonchi  ABDOMEN: Soft, non-tender, non-distended MUSCULOSKELETAL:  No edema; No deformity  SKIN: Warm and dry NEUROLOGIC:  Alert and oriented x 3 PSYCHIATRIC:  Normal affect   ASSESSMENT:    1. Claudication (HCC)   2. Dyspnea, unspecified type   3. Coronary artery disease involving native coronary artery of native heart with angina pectoris (HCC)   4. Exertional chest pain   5. Tobacco use   6. Essential hypertension    PLAN:    In order of problems listed above:  Chest pain and SOB Coronary artery calcifications Patient reports persistent exertional chest pain and shortness of breath. Dyspnea on exertion is likely multifactorial. Coronary artery calcifications noted on CT images. He takes Aspirin 81mg   daily. I will start Lipitor 40mg  daily, check lipids today. Also wills tart Coreg for HTN. I will also check an A1C. Lifestyle changes discussed in detail. Will also give SL NTG. Given persistent of symptoms, and after discussion with the patient, we will order cardiac catheterization. Risks and benefits of cardiac catheterization have been discussed with the patient.  These include bleeding, infection, kidney damage, stroke, heart attack, death.  The patient understands these risks and is willing to proceed.   PAD He reports stable claudication symptoms. ABI's showed b/l 0.92. Continue secondary prevention. Will discuss with MD possible lower extremity angiography. Start statin as above.   HLD  I will start Lipitor 40mg  daily. Check Lipid panel.  HTN BP elevated. Start coreg 6.25mg  BID. Continue Losartan  25mg  daily and amlodipine 10mg  daily.   Tobacco use Cessation encouraged  Disposition: Follow up in 1 month(s) with MD/APP   Signed, Ynez Eugenio , PA-C  09/17/2021 1:51 PM    [facility]

## 2021-09-16 NOTE — Progress Notes (Addendum)
Cardiology Office Note:    Date:  09/17/2021   ID:  DONYEA BEVERLIN, DOB Mar 18, 1956, MRN [id number]  PCP:  Abram Sander, MD  [facility] Cardiologist:  None  [facility] Electrophysiologist:  None   Referring MD: Abram Sander, MD   Chief Complaint: Testing follow-up  History of Present Illness:    KRISTINE TILEY is a 65 y.o. male with a hx of pericarditis with pericardial effusion status post pericardiocentesis more than 5 years ago, hypertension, hyperlipidemia, obesity, tobacco use, COPD, OSA on CPAP.  Seen by Dr. Rhae Lerner in 2017 for hypertension and dyspnea.  An echo at that time that showed normal LV function with mild LVH and mild pulmonary hypertension.  Seen by Dr. [place] Sink 06/29/2021 for chest pain and dyspnea follow-up.  Lexiscan Myoview and echo was ordered.  Claudication symptoms bilateral ABIs were ordered.  Myoview Lexiscan showing EF 55 to 65%, no significant ischemia or scar, coronary artery calcification and aortic atherosclerosis on CT attenuation correction, low risk study.  Echo showed LVEF 60 to 65%, no wall motion abnormality, grade 1 diastolic dysfunction, borderline dilation of the aortic root measuring 36 mm.  Bilateral ABIs 0.92 right, 0.92 left.  Imaging showed mild right lower extremity arterial disease, mild left lower extremity arterial disease.  Also noted left possible occlusion in the peroneal artery.  Today, the patient reports he has been doing the same. He has intermittent shortness of breath, this is unchanged. Has occasional sharp pains. Has sharp pains on exertion, similar to that pain. BP elevated today. No LLE, orthopnea, pnd.He uses his CPAP.  He has claudication symptoms. Feels more like fatigue. He smokes, not going quit. Drinks 4 beers a month.   Imdur Aspirin    Past Medical History:  Diagnosis Date   Bipolar 1 disorder (HCC)    COPD (chronic obstructive pulmonary disease) (HCC)    Emphysema lung (HCC)    Hypertension     Pericarditis    PTSD (post-traumatic stress disorder)     Past Surgical History:  Procedure Laterality Date   COLONOSCOPY WITH PROPOFOL N/A 09/24/2018   Procedure: COLONOSCOPY WITH PROPOFOL;  Surgeon: Wyline Mood, MD;  Location: [facility] ENDOSCOPY;  Service: Gastroenterology;  Laterality: N/A;   FRACTURE SURGERY     OTHER SURGICAL HISTORY     accident.  injured spleen, bladder, fx bilat ankles.    Current Medications: Current Meds  Medication Sig   amLODipine (NORVASC) 10 MG tablet Take 10 mg by mouth every evening.   cetirizine (ZYRTEC) 10 MG tablet Take 10 mg by mouth daily.   citalopram (CELEXA) 20 MG tablet Take 20 mg by mouth daily.   fluticasone (FLONASE) 50 MCG/ACT nasal spray Place 1 spray into both nostrils daily.   Fluticasone-Salmeterol (ADVAIR) 250-50 MCG/DOSE AEPB Inhale 1 puff into the lungs every 12 (twelve) hours.   lamoTRIgine (LAMICTAL) 100 MG tablet Take 200 mg by mouth daily.   losartan (COZAAR) 25 MG tablet Take 25 mg by mouth daily.   Multiple Vitamin (MULTIVITAMIN WITH MINERALS) TABS tablet Take 1 tablet by mouth daily.   pantoprazole (PROTONIX) 40 MG tablet Take 40 mg by mouth 2 (two) times daily.    prazosin (MINIPRESS) 2 MG capsule Take 2 mg by mouth at bedtime.    tiotropium (SPIRIVA) 18 MCG inhalation capsule Place 18 mcg into inhaler and inhale daily.     Allergies:   Chlorpromazine and Pravastatin   Social History   Socioeconomic History   Marital status: Single  Spouse name: Not on file   Number of children: Not on file   Years of education: Not on file   Highest education level: Not on file  Occupational History   Not on file  Tobacco Use   Smoking status: Every Day    Packs/day: 2.00    Years: 48.00    Pack years: 96.00    Types: Cigarettes   Smokeless tobacco: Never   Tobacco comments:    is trying to cut back  Vaping Use   Vaping Use: Never used  Substance and Sexual Activity   Alcohol use: Yes    Comment: occassional   Drug use:  Yes    Types: Marijuana   Sexual activity: Not on file  Other Topics Concern   Not on file  Social History Narrative   Not on file   Social Determinants of Health   Financial Resource Strain: Not on file  Food Insecurity: Not on file  Transportation Needs: Not on file  Physical Activity: Not on file  Stress: Not on file  Social Connections: Not on file     Family History: The patient's family history includes Heart disease in his father.  ROS:   Please see the history of present illness.     All other systems reviewed and are negative.  EKGs/Labs/Other Studies Reviewed:    The following studies were reviewed today:  Echo 1. Left ventricular ejection fraction, by estimation, is 60 to 65%. The  left ventricle has normal function. The left ventricle has no regional  wall motion abnormalities. Left ventricular diastolic parameters are  consistent with Grade I diastolic  dysfunction (impaired relaxation).   2. Right ventricular systolic function is normal. The right ventricular  size is normal. Tricuspid regurgitation signal is inadequate for assessing  PA pressure.   3. The mitral valve is normal in structure. No evidence of mitral valve  regurgitation. No evidence of mitral stenosis.   4. The aortic valve is normal in structure. Aortic valve regurgitation is  not visualized. No aortic stenosis is present.   5. There is borderline dilatation of the aortic root, measuring 36 mm.  There is dilatation of the ascending aorta,35 mm.   Myoview Lexiscan Narrative & Impression  Normal pharmacologic myocardial perfusion stress test without significant ischemia or scar. The left ventricular ejection fraction is normal (55-65%). Coronary artery calcification and aortic atherosclerosis are noted on the attenuation correction CT, as well as hepatic steatosis. This is a low risk study.    Vas [place] ABI  Current ABI: 0.92 right, 0.92 left  Summary:  Left: Possible occlusion noted in  the peroneal artery.  US duplex Summary:  Right: Resting right ankle-brachial index indicates mild right lower  extremity arterial disease. The right toe-brachial index is normal.   Left: Resting left ankle-brachial index indicates mild left lower  extremity arterial disease. The left toe-brachial index is normal.   EKG:  EKG is  ordered today.  The ekg ordered today demonstrates NSR, 99bpm, nonspecific ST changes  Recent Labs: No results found for requested labs within last 8760 hours.  Recent Lipid Panel No results found for: CHOL, TRIG, HDL, CHOLHDL, VLDL, LDLCALC, LDLDIRECT   Physical Exam:    VS:  BP (!) 152/60 (BP Location: Left Arm, Patient Position: Sitting, Cuff Size: Normal)   Ht 6' (1.829 m)   Wt 235 lb 6 oz (106.8 kg)   SpO2 98%   BMI 31.92 kg/m     Wt Readings from Last 3  Encounters:  09/17/21 235 lb 6 oz (106.8 kg)  06/29/21 242 lb 6 oz (109.9 kg)  10/14/20 230 lb (104.3 kg)     GEN:  Well nourished, well developed in no acute distress HEENT: Normal NECK: No JVD; No carotid bruits LYMPHATICS: No lymphadenopathy CARDIAC: RRR, no murmurs, rubs, gallops RESPIRATORY:  Clear to auscultation without rales, wheezing or rhonchi  ABDOMEN: Soft, non-tender, non-distended MUSCULOSKELETAL:  No edema; No deformity  SKIN: Warm and dry NEUROLOGIC:  Alert and oriented x 3 PSYCHIATRIC:  Normal affect   ASSESSMENT:    1. Claudication (HCC)   2. Dyspnea, unspecified type   3. Coronary artery disease involving native coronary artery of native heart with angina pectoris (HCC)   4. Exertional chest pain   5. Tobacco use   6. Essential hypertension    PLAN:    In order of problems listed above:  Chest pain and SOB Coronary artery calcifications Patient reports persistent exertional chest pain and shortness of breath. Dyspnea on exertion is likely multifactorial. Coronary artery calcifications noted on CT images. He takes Aspirin 81mg  daily. I will start Lipitor 40mg   daily, check lipids today. Also wills tart Coreg for HTN. I will also check an A1C. Lifestyle changes discussed in detail. Will also give SL NTG. Given persistent of symptoms, and after discussion with the patient, we will order cardiac catheterization. Risks and benefits of cardiac catheterization have been discussed with the patient.  These include bleeding, infection, kidney damage, stroke, heart attack, death.  The patient understands these risks and is willing to proceed.   PAD He reports stable claudication symptoms. ABI's showed b/l 0.92. Continue secondary prevention. Will discuss with MD possible lower extremity angiography. Start statin as above.   HLD  I will start Lipitor 40mg  daily. Check Lipid panel.  HTN BP elevated. Start coreg 6.25mg  BID. Continue Losartan  25mg  daily and amlodipine 10mg  daily.   Tobacco use Cessation encouraged  Disposition: Follow up in 1 month(s) with MD/APP   Signed, Gwenda Heiner , PA-C  09/17/2021 1:51 PM    [facility]

## 2021-09-16 NOTE — Progress Notes (Unsigned)
IV  

## 2021-09-17 ENCOUNTER — Encounter: Payer: Self-pay | Admitting: Medical

## 2021-09-17 ENCOUNTER — Ambulatory Visit (INDEPENDENT_AMBULATORY_CARE_PROVIDER_SITE_OTHER): Payer: Medicaid Other | Admitting: Medical

## 2021-09-17 VITALS — BP 152/60 | Ht 72.0 in | Wt 235.4 lb

## 2021-09-17 DIAGNOSIS — I739 Peripheral vascular disease, unspecified: Secondary | ICD-10-CM | POA: Diagnosis not present

## 2021-09-17 DIAGNOSIS — I10 Essential (primary) hypertension: Secondary | ICD-10-CM

## 2021-09-17 DIAGNOSIS — R0600 Dyspnea, unspecified: Secondary | ICD-10-CM | POA: Diagnosis not present

## 2021-09-17 DIAGNOSIS — R079 Chest pain, unspecified: Secondary | ICD-10-CM

## 2021-09-17 DIAGNOSIS — I25119 Atherosclerotic heart disease of native coronary artery with unspecified angina pectoris: Secondary | ICD-10-CM | POA: Diagnosis not present

## 2021-09-17 DIAGNOSIS — Z720 Tobacco use: Secondary | ICD-10-CM

## 2021-09-17 MED ORDER — CARVEDILOL 6.25 MG PO TABS: 6.2500 mg | ORAL_TABLET | Freq: Two times a day (BID) | ORAL | 3 refills | Status: AC

## 2021-09-17 MED ORDER — NITROGLYCERIN 0.4 MG SL SUBL: 0.4000 mg | SUBLINGUAL_TABLET | SUBLINGUAL | 3 refills | Status: AC | PRN

## 2021-09-17 MED ORDER — ATORVASTATIN CALCIUM 40 MG PO TABS: 40.0000 mg | ORAL_TABLET | Freq: Every day | ORAL | 3 refills | Status: AC

## 2021-09-17 MED ORDER — SODIUM CHLORIDE 0.9% FLUSH: 3.0000 mL | Freq: Two times a day (BID) | INTRAVENOUS | Status: AC

## 2021-09-17 NOTE — Patient Instructions (Signed)
Medication Instructions:  Your physician has recommended you make the following change in your medication: 1.  START Lipitor 40 mg taking 1 daily 2.  START Coreg 6.25 taking 1 twice a day 3.  START Nitroglycerin 0.4 s/l tablet, taking only as needed for chest pain   *If you need a refill on your cardiac medications before your next appointment, please call your pharmacy*   Lab Work: TODAY:  BMET, CBC, HGBA1C, & LIPID  If you have labs (blood work) drawn today and your tests are completely normal, you will receive your results only by: MyChart Message (if you have MyChart) OR A paper copy in the mail If you have any lab test that is abnormal or we need to change your treatment, we will call you to review the results.   Testing/Procedures: Your physician has requested that you have a cardiac catheterization. Cardiac catheterization is used to diagnose and/or treat various heart conditions. Doctors may recommend this procedure for a number of different reasons. The most common reason is to evaluate chest pain. Chest pain can be a symptom of coronary artery disease (CAD), and cardiac catheterization can show whether plaque is narrowing or blocking your heart's arteries. This procedure is also used to evaluate the valves, as well as measure the blood flow and oxygen levels in different parts of your heart. For further information please visit [email or web address] Please follow instruction sheet, BELOW:   [facility] CARDIOVASCULAR DIVISION [facility] [address] [place], [address] [place] [address] Dept: [phone] Loc: [phone]  Randy Freeman  09/17/2021  You are scheduled for a Cardiac Catheterization on Monday, October 10 with Dr. Lorine Freeman.  1. Please arrive at the [facility] (Main Entrance A) at [facility]: [address] [place], [place] [address] at 9:30 AM (This time is two hours before your procedure to ensure your  preparation). Free valet parking service is available.   Special note: Every effort is made to have your procedure done on time. Please understand that emergencies sometimes delay scheduled procedures.  2. Diet: Do not eat solid foods after midnight.  The patient may have clear liquids until 5am upon the day of the procedure.  3. Labs: WERE DONE TODAY  4. Medication instructions in preparation for your procedure:   Contrast Allergy: No   On the morning of your procedure, take your Aspirin and any morning medicines NOT listed above.  You may use sips of water.  5. Plan for one night stay--bring personal belongings. 6. Bring a current list of your medications and current insurance cards. 7. You MUST have a responsible person to drive you home. 8. Someone MUST be with you the first 24 hours after you arrive home or your discharge will be delayed. 9. Please wear clothes that are easy to get on and off and wear slip-on shoes.  Thank you for allowing [place] to care for you!   -- [facility]   Follow-Up: At [facility], you and your health needs are our priority.  As part of our continuing mission to provide you with exceptional heart care, we have created designated Provider Care Teams.  These Care Teams include your primary Cardiologist (physician) and Advanced Practice Providers (APPs -  Physician Assistants and Nurse Practitioners) who all work together to provide you with the care you need, when you need it.  We recommend signing up for the patient portal called "MyChart".  Sign up information is  provided on this After Visit Summary.  MyChart is used to connect with patients for Virtual Visits (Telemedicine).  Patients are able to view lab/test results, encounter notes, upcoming appointments, etc.  Non-urgent messages can be sent to your provider as well.   To learn more about what you can do with MyChart, go to [email or web address].    Your next  appointment:   2 week(s)  The format for your next appointment:   In Person  Provider:   You may see Dr. Kirke Freeman or one of the following Advanced Practice Providers on your designated Care Team:   Randy Ducking, NP Randy Listen, PA-C Randy Ivan, PA-C Randy [place], [place]   Other Instructions

## 2021-09-18 LAB — BASIC METABOLIC PANEL
BUN/Creatinine Ratio: 13 (ref 10–24)
BUN: 12 mg/dL (ref 8–27)
CO2: 25 mmol/L (ref 20–29)
Calcium: 9.9 mg/dL (ref 8.6–10.2)
Chloride: 92 mmol/L — ABNORMAL LOW (ref 96–106)
Creatinine, Ser: 0.96 mg/dL (ref 0.76–1.27)
Glucose: 100 mg/dL — ABNORMAL HIGH (ref 70–99)
Potassium: 3.9 mmol/L (ref 3.5–5.2)
Sodium: 138 mmol/L (ref 134–144)
eGFR: 89 mL/min/{1.73_m2} (ref >59)

## 2021-09-18 LAB — HEMOGLOBIN A1C
Est. average glucose Bld gHb Est-mCnc: 126 mg/dL
Hgb A1c MFr Bld: 6 % — ABNORMAL HIGH (ref 4.8–5.6)

## 2021-09-18 LAB — CBC
Hematocrit: 48.8 % (ref 37.5–51.0)
Hemoglobin: 17.3 g/dL (ref 13.0–17.7)
MCH: 33.3 pg — ABNORMAL HIGH (ref 26.6–33.0)
MCHC: 35.5 g/dL (ref 31.5–35.7)
MCV: 94 fL (ref 79–97)
Platelets: 366 10*3/uL (ref 150–450)
RBC: 5.2 x10E6/uL (ref 4.14–5.80)
RDW: 12.1 % (ref 11.6–15.4)
WBC: 10.3 10*3/uL (ref 3.4–10.8)

## 2021-09-18 LAB — LIPID PANEL
Chol/HDL Ratio: 6.9 ratio — ABNORMAL HIGH (ref 0.0–5.0)
Cholesterol, Total: 206 mg/dL — ABNORMAL HIGH (ref 100–199)
HDL: 30 mg/dL — ABNORMAL LOW (ref >39)
LDL Chol Calc (NIH): 134 mg/dL — ABNORMAL HIGH (ref 0–99)
Triglycerides: 230 mg/dL — ABNORMAL HIGH (ref 0–149)
VLDL Cholesterol Cal: 42 mg/dL — ABNORMAL HIGH (ref 5–40)

## 2021-09-20 ENCOUNTER — Other Ambulatory Visit: Payer: Self-pay

## 2021-09-20 ENCOUNTER — Encounter
Admission: RE | Disposition: A | Discharge status: Home / Self Care | Payer: Self-pay | Source: Home / Self Care | Attending: Cardiovascular Disease

## 2021-09-20 ENCOUNTER — Encounter: Payer: Self-pay | Admitting: Cardiovascular Disease

## 2021-09-20 ENCOUNTER — Ambulatory Visit
Admission: RE | Admit: 2021-09-20 | Discharge: 2021-09-20 | Disposition: A | Discharge status: Home / Self Care | Payer: Medicaid Other | Attending: Cardiovascular Disease | Admitting: Cardiovascular Disease

## 2021-09-20 DIAGNOSIS — Z79899 Other long term (current) drug therapy: Secondary | ICD-10-CM | POA: Diagnosis not present

## 2021-09-20 DIAGNOSIS — F17210 Nicotine dependence, cigarettes, uncomplicated: Secondary | ICD-10-CM | POA: Diagnosis not present

## 2021-09-20 DIAGNOSIS — G4733 Obstructive sleep apnea (adult) (pediatric): Secondary | ICD-10-CM | POA: Insufficient documentation

## 2021-09-20 DIAGNOSIS — R0609 Other forms of dyspnea: Secondary | ICD-10-CM | POA: Insufficient documentation

## 2021-09-20 DIAGNOSIS — E669 Obesity, unspecified: Secondary | ICD-10-CM | POA: Insufficient documentation

## 2021-09-20 DIAGNOSIS — I2510 Atherosclerotic heart disease of native coronary artery without angina pectoris: Secondary | ICD-10-CM | POA: Diagnosis not present

## 2021-09-20 DIAGNOSIS — I10 Essential (primary) hypertension: Secondary | ICD-10-CM | POA: Insufficient documentation

## 2021-09-20 DIAGNOSIS — Z8249 Family history of ischemic heart disease and other diseases of the circulatory system: Secondary | ICD-10-CM | POA: Insufficient documentation

## 2021-09-20 DIAGNOSIS — E785 Hyperlipidemia, unspecified: Secondary | ICD-10-CM | POA: Diagnosis not present

## 2021-09-20 DIAGNOSIS — Z7982 Long term (current) use of aspirin: Secondary | ICD-10-CM | POA: Insufficient documentation

## 2021-09-20 DIAGNOSIS — Z6831 Body mass index (BMI) 31.0-31.9, adult: Secondary | ICD-10-CM | POA: Diagnosis not present

## 2021-09-20 DIAGNOSIS — Z7951 Long term (current) use of inhaled steroids: Secondary | ICD-10-CM | POA: Diagnosis not present

## 2021-09-20 DIAGNOSIS — I739 Peripheral vascular disease, unspecified: Secondary | ICD-10-CM | POA: Insufficient documentation

## 2021-09-20 DIAGNOSIS — I25119 Atherosclerotic heart disease of native coronary artery with unspecified angina pectoris: Secondary | ICD-10-CM | POA: Diagnosis not present

## 2021-09-20 DIAGNOSIS — R079 Chest pain, unspecified: Secondary | ICD-10-CM

## 2021-09-20 HISTORY — PX: LEFT HEART CATH AND CORONARY ANGIOGRAPHY: CATH118249

## 2021-09-20 SURGERY — LEFT HEART CATH AND CORONARY ANGIOGRAPHY
Anesthesia: Moderate Sedation | Laterality: Left

## 2021-09-20 MED ORDER — HEPARIN SODIUM (PORCINE) 1000 UNIT/ML IJ SOLN
INTRAMUSCULAR | Status: AC
  Filled 2021-09-20: qty 1

## 2021-09-20 MED ORDER — SODIUM CHLORIDE 0.9% FLUSH: 3.0000 mL | Freq: Two times a day (BID) | INTRAVENOUS | Status: DC

## 2021-09-20 MED ORDER — HEPARIN (PORCINE) IN NACL 2000-0.9 UNIT/L-% IV SOLN
INTRAVENOUS | Status: DC | PRN
  Administered 2021-09-20: 1000 mL

## 2021-09-20 MED ORDER — ACETAMINOPHEN 325 MG PO TABS: 650.0000 mg | ORAL_TABLET | ORAL | Status: DC | PRN

## 2021-09-20 MED ORDER — SODIUM CHLORIDE 0.9 % WEIGHT BASED INFUSION: 1.0000 mL/kg/h | INTRAVENOUS | Status: DC

## 2021-09-20 MED ORDER — SODIUM CHLORIDE 0.9% FLUSH: 3.0000 mL | INTRAVENOUS | Status: DC | PRN

## 2021-09-20 MED ORDER — SODIUM CHLORIDE 0.9 % IV SOLN: 250.0000 mL | INTRAVENOUS | Status: DC | PRN

## 2021-09-20 MED ORDER — MIDAZOLAM HCL 2 MG/2ML IJ SOLN
INTRAMUSCULAR | Status: DC | PRN
  Administered 2021-09-20: 1 mg via INTRAVENOUS

## 2021-09-20 MED ORDER — SODIUM CHLORIDE 0.9 % IV SOLN: INTRAVENOUS | Status: DC

## 2021-09-20 MED ORDER — VERAPAMIL HCL 2.5 MG/ML IV SOLN
INTRAVENOUS | Status: DC | PRN
  Administered 2021-09-20: 2.5 mg via INTRA_ARTERIAL

## 2021-09-20 MED ORDER — FENTANYL CITRATE (PF) 100 MCG/2ML IJ SOLN
INTRAMUSCULAR | Status: AC
  Filled 2021-09-20: qty 2

## 2021-09-20 MED ORDER — VERAPAMIL HCL 2.5 MG/ML IV SOLN
INTRAVENOUS | Status: AC
  Filled 2021-09-20: qty 2

## 2021-09-20 MED ORDER — ASPIRIN 81 MG PO CHEW: 81.0000 mg | CHEWABLE_TABLET | ORAL | Status: DC

## 2021-09-20 MED ORDER — LIDOCAINE HCL 1 % IJ SOLN
INTRAMUSCULAR | Status: AC
  Filled 2021-09-20: qty 20

## 2021-09-20 MED ORDER — MIDAZOLAM HCL 2 MG/2ML IJ SOLN
INTRAMUSCULAR | Status: AC
  Filled 2021-09-20: qty 2

## 2021-09-20 MED ORDER — LIDOCAINE HCL (PF) 1 % IJ SOLN
INTRAMUSCULAR | Status: DC | PRN
  Administered 2021-09-20: 2 mL

## 2021-09-20 MED ORDER — ONDANSETRON HCL 4 MG/2ML IJ SOLN: 4.0000 mg | Freq: Four times a day (QID) | INTRAMUSCULAR | Status: DC | PRN

## 2021-09-20 MED ORDER — SODIUM CHLORIDE 0.9 % WEIGHT BASED INFUSION
3.0000 mL/kg/h | INTRAVENOUS | Status: DC
  Administered 2021-09-20: 3 mL/kg/h via INTRAVENOUS

## 2021-09-20 MED ORDER — IOHEXOL 350 MG/ML SOLN
INTRAVENOUS | Status: DC | PRN
  Administered 2021-09-20: 43 mL

## 2021-09-20 MED ORDER — HEPARIN (PORCINE) IN NACL 1000-0.9 UT/500ML-% IV SOLN
INTRAVENOUS | Status: AC
  Filled 2021-09-20: qty 1000

## 2021-09-20 MED ORDER — FENTANYL CITRATE (PF) 100 MCG/2ML IJ SOLN
INTRAMUSCULAR | Status: DC | PRN
  Administered 2021-09-20: 50 ug via INTRAVENOUS

## 2021-09-20 SURGICAL SUPPLY — 10 items
CATH INFINITI 5FR JK (CATHETERS) ×2 IMPLANT
DEVICE RAD COMP TR BAND LRG (VASCULAR PRODUCTS) ×2 IMPLANT
DRAPE BRACHIAL (DRAPES) ×2 IMPLANT
GLIDESHEATH SLEND SS 6F .021 (SHEATH) ×2 IMPLANT
GUIDEWIRE INQWIRE 1.5J.035X260 (WIRE) ×1 IMPLANT
INQWIRE 1.5J .035X260CM (WIRE) ×2
PACK CARDIAC CATH (CUSTOM PROCEDURE TRAY) ×2 IMPLANT
PROTECTION STATION PRESSURIZED (MISCELLANEOUS) ×2
SET ATX SIMPLICITY (MISCELLANEOUS) ×2 IMPLANT
STATION PROTECTION PRESSURIZED (MISCELLANEOUS) ×1 IMPLANT

## 2021-09-20 NOTE — Interval H&P Note (Signed)
History and Physical Interval Note:  09/20/2021 1:06 PM  Randy Freeman  has presented today for surgery, with the diagnosis of LT Cath   Chest pain on exertion   Shortness of breath.  The various methods of treatment have been discussed with the patient and family. After consideration of risks, benefits and other options for treatment, the patient has consented to  Procedure(s): LEFT HEART CATH AND CORONARY ANGIOGRAPHY (Left) as a surgical intervention.  The patient's history has been reviewed, patient examined, no change in status, stable for surgery.  I have reviewed the patient's chart and labs.  Questions were answered to the patient's satisfaction.     Lorine Bears

## 2021-09-22 NOTE — Addendum Note (Signed)
Addended by: Kendrick Fries on: 09/22/2021 02:30 PM   Modules accepted: Orders

## 2021-09-30 ENCOUNTER — Ambulatory Visit (INDEPENDENT_AMBULATORY_CARE_PROVIDER_SITE_OTHER): Payer: Medicaid Other | Admitting: Medical

## 2021-09-30 ENCOUNTER — Encounter: Payer: Self-pay | Admitting: Medical

## 2021-09-30 ENCOUNTER — Other Ambulatory Visit: Payer: Self-pay

## 2021-09-30 VITALS — BP 148/74 | HR 90 | Ht 72.0 in | Wt 241.2 lb

## 2021-09-30 DIAGNOSIS — I25119 Atherosclerotic heart disease of native coronary artery with unspecified angina pectoris: Secondary | ICD-10-CM

## 2021-09-30 NOTE — Progress Notes (Signed)
Cardiology Office Note:    Date:  09/30/2021   ID:  Randy Freeman, DOB [birth year], MRN [id number]  PCP:  Abram Sander, MD  [facility] Cardiologist:  None  [facility] Electrophysiologist:  None   Referring MD: Abram Sander, MD   Chief Complaint: Hospital follow-up  History of Present Illness:    Randy Freeman is a 65 y.o. male with a hx of pericarditis with pericardial effusion status post pericardiocentesis more than 5 years ago, hypertension, hyperlipidemia, obesity, tobacco use, COPD, OSA on CPAP.   Seen by Dr. Rhae Lerner in 2017 for hypertension and dyspnea.  An echo at that time that showed normal LV function with mild LVH and mild pulmonary hypertension.   Seen by Dr. [place] Sink 06/29/2021 for chest pain and dyspnea follow-up.  Lexiscan Myoview and echo was ordered.  Claudication symptoms bilateral ABIs were ordered.   Myoview Lexiscan showing EF 55 to 65%, no significant ischemia or scar, coronary artery calcification and aortic atherosclerosis on CT attenuation correction, low risk study.  Echo showed LVEF 60 to 65%, no wall motion abnormality, grade 1 diastolic dysfunction, borderline dilation of the aortic root measuring 36 mm.  Bilateral ABIs 0.92 right, 0.92 left.  Imaging showed mild right lower extremity arterial disease, mild left lower extremity arterial disease.  Also noted left possible occlusion in the peroneal artery.  Seen 09/17/21 and reported shortness of breath and exertional chest pain. He was set up for cardiac cath which showed nonobstructive CAD.   Today, cardiac cath was briefly discussed. See note at A&P.   Past Medical History:  Diagnosis Date   Bipolar 1 disorder (HCC)    COPD (chronic obstructive pulmonary disease) (HCC)    Emphysema lung (HCC)    Hypertension    Pericarditis    PTSD (post-traumatic stress disorder)     Past Surgical History:  Procedure Laterality Date   COLONOSCOPY WITH PROPOFOL N/A 09/24/2018   Procedure: COLONOSCOPY  WITH PROPOFOL;  Surgeon: Wyline Mood, MD;  Location: [facility] ENDOSCOPY;  Service: Gastroenterology;  Laterality: N/A;   FRACTURE SURGERY     LEFT HEART CATH AND CORONARY ANGIOGRAPHY Left 09/20/2021   Procedure: LEFT HEART CATH AND CORONARY ANGIOGRAPHY;  Surgeon: Iran Ouch, MD;  Location: [facility] INVASIVE CV LAB;  Service: Cardiovascular;  Laterality: Left;   OTHER SURGICAL HISTORY     accident.  injured spleen, bladder, fx bilat ankles.    Current Medications: No outpatient medications have been marked as taking for the 09/30/21 encounter (Appointment) with Fransico Michael, Teofilo Lupinacci H, PA-C.   Current Facility-Administered Medications for the 09/30/21 encounter (Appointment) with Fransico Michael, Yunus Stoklosa H, PA-C  Medication   sodium chloride flush (NS) 0.9 % injection 3 mL     Allergies:   Chlorpromazine and Pravastatin   Social History   Socioeconomic History   Marital status: Single    Spouse name: Not on file   Number of children: Not on file   Years of education: Not on file   Highest education level: Not on file  Occupational History   Not on file  Tobacco Use   Smoking status: Every Day    Packs/day: 2.00    Years: 48.00    Pack years: 96.00    Types: Cigarettes   Smokeless tobacco: Never   Tobacco comments:    Is currently down to 1 pack a day as of 10/*10/22  Vaping Use   Vaping Use: Never used  Substance and Sexual Activity   Alcohol use: Yes  Comment: occassional   Drug use: Yes    Types: Marijuana   Sexual activity: Not on file  Other Topics Concern   Not on file  Social History Narrative   Not on file   Social Determinants of Health   Financial Resource Strain: Not on file  Food Insecurity: Not on file  Transportation Needs: Not on file  Physical Activity: Not on file  Stress: Not on file  Social Connections: Not on file     Family History: The patient's family history includes Heart disease in his father.  ROS:   Please see the history of present illness.      All other systems reviewed and are negative.  EKGs/Labs/Other Studies Reviewed:    The following studies were reviewed today:  LHC 09/20/21   Prox RCA lesion is 40% stenosed.   The left ventricular systolic function is normal.   LV end diastolic pressure is mildly elevated.   The left ventricular ejection fraction is 55-65% by visual estimate.   1.  Mildly calcified coronary arteries with mild to moderate one-vessel coronary artery disease.  No evidence of obstructive disease. 2.  Normal LV systolic function and mildly elevated left ventricular end-diastolic pressure.   Recommendations: Recommend aggressive medical therapy and healthy lifestyle changes  Echo 09/16/21  1. Left ventricular ejection fraction, by estimation, is 60 to 65%. The  left ventricle has normal function. The left ventricle has no regional  wall motion abnormalities. Left ventricular diastolic parameters are  consistent with Grade I diastolic  dysfunction (impaired relaxation).   2. Right ventricular systolic function is normal. The right ventricular  size is normal. Tricuspid regurgitation signal is inadequate for assessing  PA pressure.   3. The mitral valve is normal in structure. No evidence of mitral valve  regurgitation. No evidence of mitral stenosis.   4. The aortic valve is normal in structure. Aortic valve regurgitation is  not visualized. No aortic stenosis is present.   5. There is borderline dilatation of the aortic root, measuring 36 mm.  There is dilatation of the ascending aorta,35 mm.   EKG:  EKG is not ordered today  Recent Labs: 09/17/2021: BUN 12; Creatinine, Ser 0.96; Hemoglobin 17.3; Platelets 366; Potassium 3.9; Sodium 138  Recent Lipid Panel    Component Value Date/Time   CHOL 206 (H) 09/17/2021 1404   TRIG 230 (H) 09/17/2021 1404   HDL 30 (L) 09/17/2021 1404   CHOLHDL 6.9 (H) 09/17/2021 1404   LDLCALC 134 (H) 09/17/2021 1404     Physical Exam:    VS:  There were no  vitals taken for this visit.    Wt Readings from Last 3 Encounters:  09/20/21 235 lb (106.6 kg)  09/17/21 235 lb 6 oz (106.8 kg)  06/29/21 242 lb 6 oz (109.9 kg)    No exam performed  GEN:  Well nourished, well developed in no acute distress HEENT: Normal NECK: No JVD; No carotid bruits LYMPHATICS: No lymphadenopathy CARDIAC: RRR, no murmurs, rubs, gallops RESPIRATORY:  Clear to auscultation without rales, wheezing or rhonchi  ABDOMEN: Soft, non-tender, non-distended MUSCULOSKELETAL:  No edema; No deformity  SKIN: Warm and dry NEUROLOGIC:  Alert and oriented x 3 PSYCHIATRIC:  Normal affect   ASSESSMENT:    No diagnosis found. PLAN:    In order of problems listed above:  I entered the room. Asked him if he remembers the results of the cardiac catheterization. He said, yes, I only have a 40% blockage. I asked if he  was still having shortness of breath and chest pain. He said yes. I asked if he has seen pulmonology. He said, yes, he has been in and out of pulmonology. I said well its reassuring the symptoms are not from your heart. He said said ya well now the insurance is saying they are not going to pay for the cardiac catheterization since it's only a 40% blockage, they would have paid if it was a 70% blockage. So you ordered a test that was unnecessary. I can't pay for that I live off of $700 a month, how am I going to fork up thousands of dollars. By this point he was raising his voice. He said "I just came here to tell you this". Patient was cursing. I was quiet and did not say a word. He got up, walked out and slammed the door.   Disposition: Follow up PRN  Signed, Hayk Divis David Stall, PA-C  09/30/2021 8:53 AM    [facility]

## 2022-06-01 IMAGING — CT CT CHEST LUNG CANCER SCREENING LOW DOSE W/O CM
2 of 5 series · 15 of 40 positions shown, 18 images · non-contrast
Comparison: Standard CT chest 10/01/2019. Lung cancer screening CT
02/12/2019

CLINICAL DATA: 64-year-old male with 97 pack-year history of
smoking. Lung cancer screening.

EXAM:
CT CHEST WITHOUT CONTRAST LOW-DOSE FOR LUNG CANCER SCREENING
TECHNIQUE: Multidetector CT imaging of the chest was performed following the
standard protocol without IV contrast.

[Series 3: lung 1.00 · axial · 0.71mm/px · z∈[-1239,-902]mm · 12 of 373 slices shown, 15 images]
[im 18/373  mediastinal]
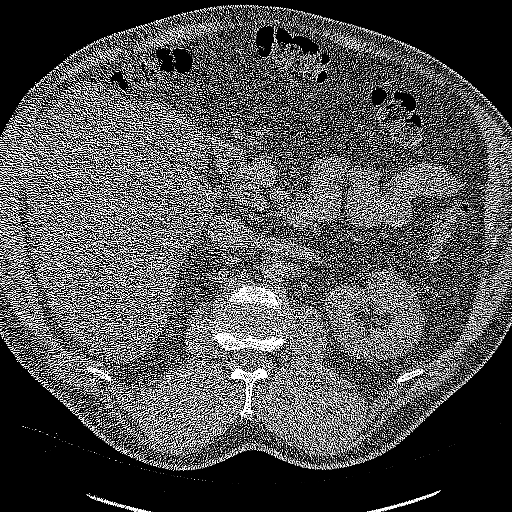
[im 18/373  lung]
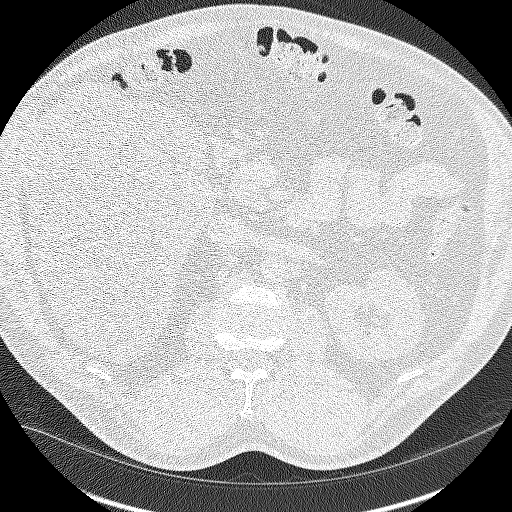
[im 54/373  lung]
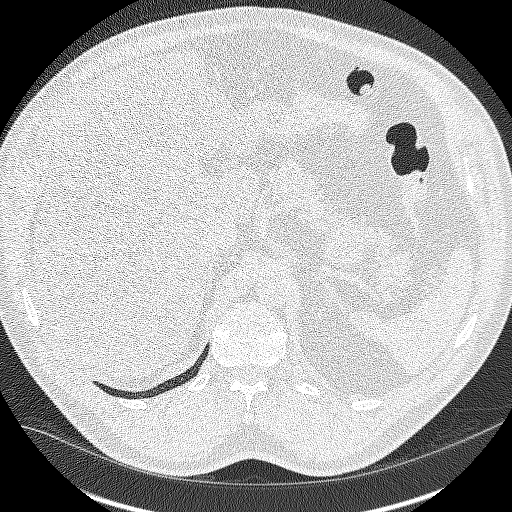
[im 89/373  lung]
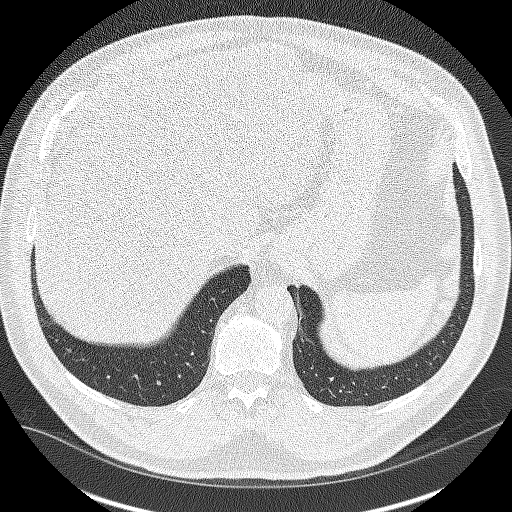
[im 107/373  lung]
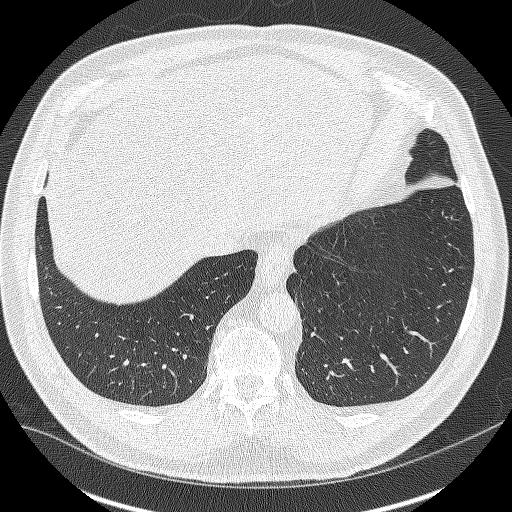
[im 142/373  mediastinal]
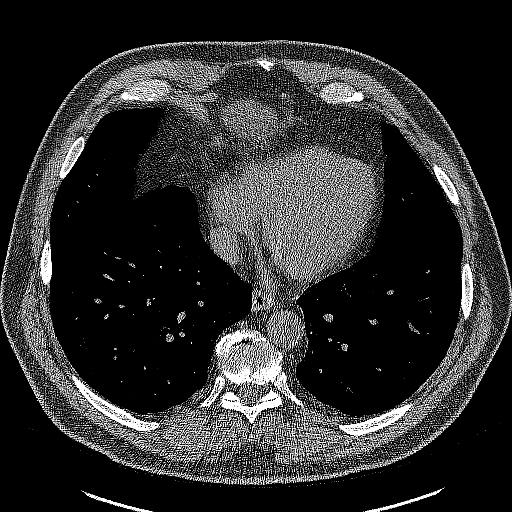
[im 142/373  lung]
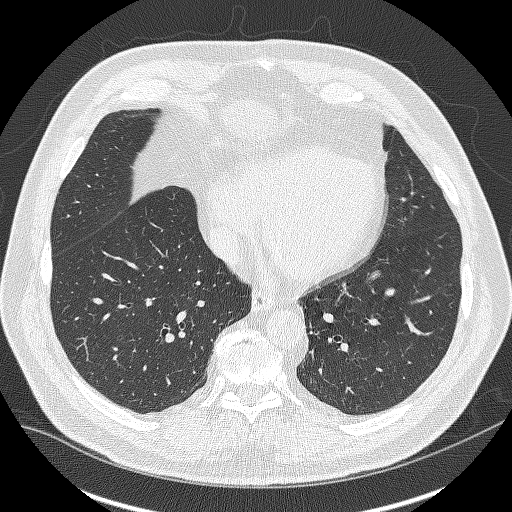
[im 178/373  lung]
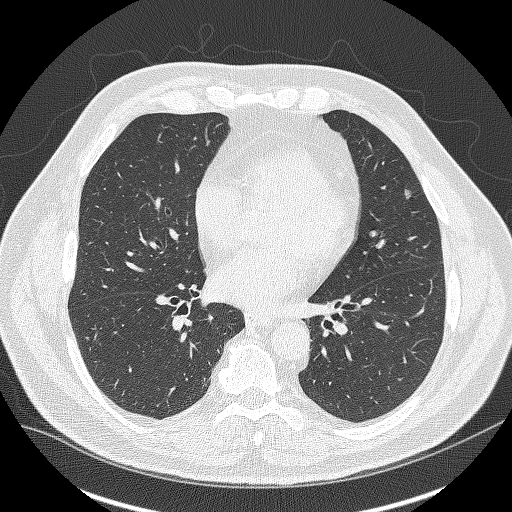
[im 195/373  lung]
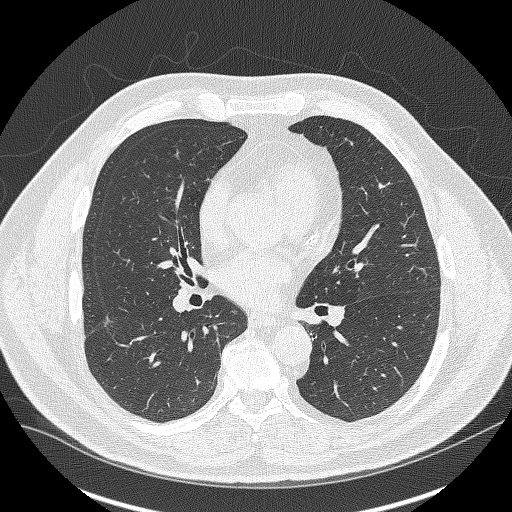
[im 231/373  lung]
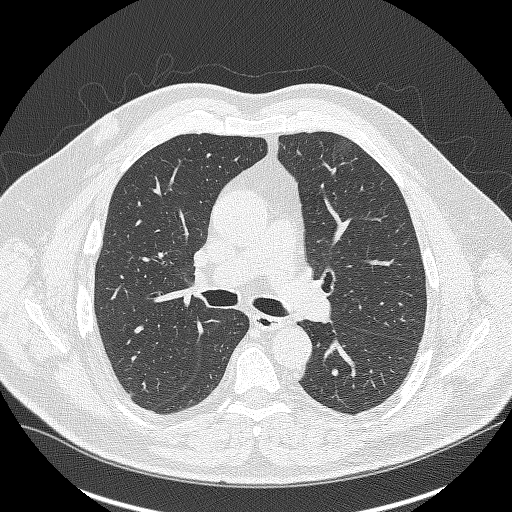
[im 266/373  mediastinal]
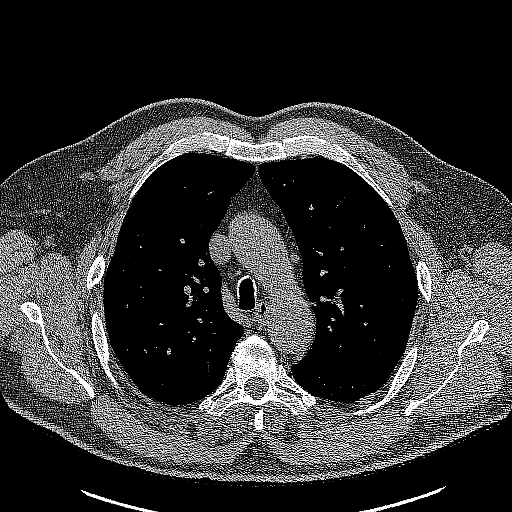
[im 266/373  lung]
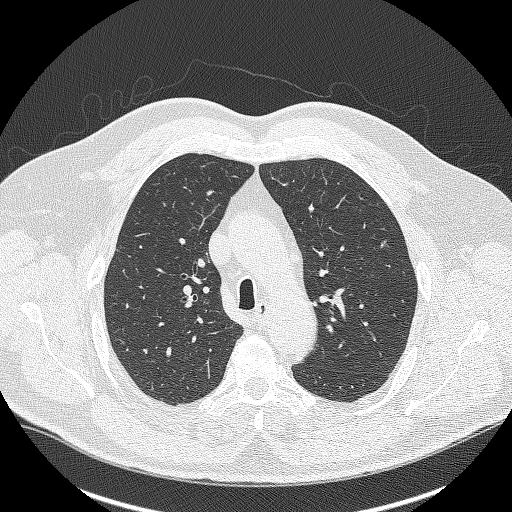
[im 284/373  lung]
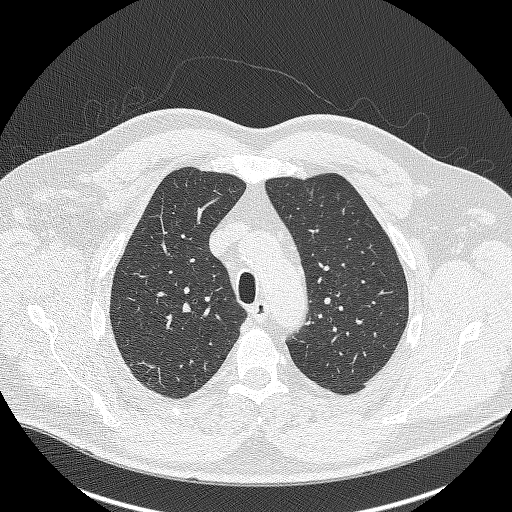
[im 319/373  lung]
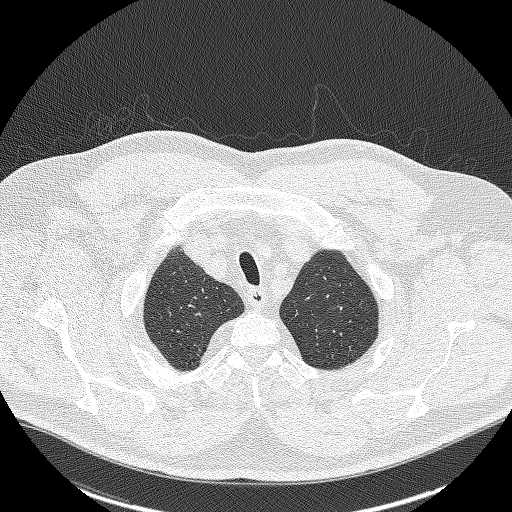
[im 355/373  lung]
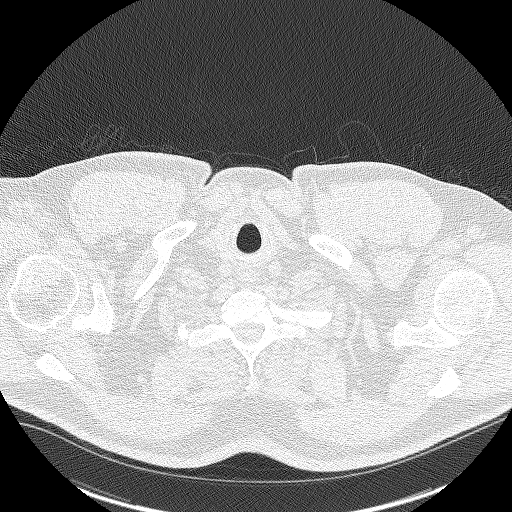

[Series 4: coronals lung 1.00 cor · coronal · 0.71mm/px · 3 of 340 slices shown]
[im 68/340  lung]
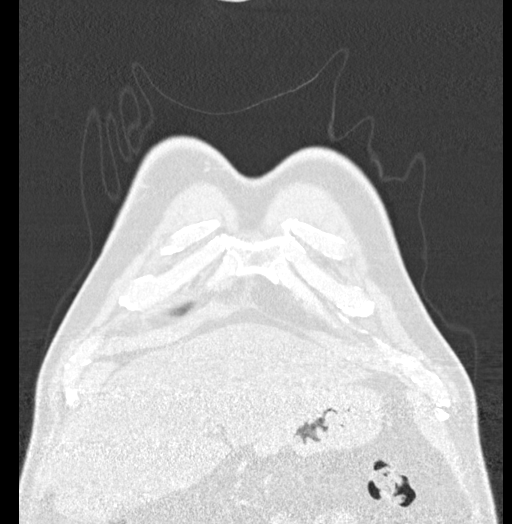
[im 136/340  lung]
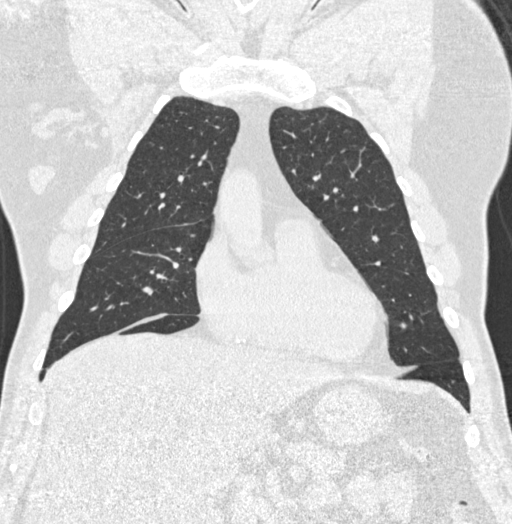
[im 204/340  lung]
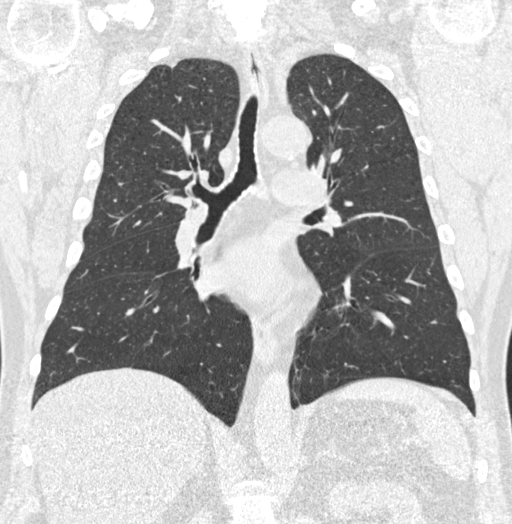

[15 of 40 positions shown; findings below may reference images not displayed]

FINDINGS: Cardiovascular: The heart size is normal. No substantial pericardial
effusion. Coronary artery calcification is evident. Atherosclerotic
calcification is noted in the wall of the thoracic aorta.

Mediastinum/Nodes: No mediastinal lymphadenopathy. No evidence for
gross hilar lymphadenopathy although assessment is limited by the
lack of intravenous contrast on today's study. Similar appearance of
upper normal axillary nodes bilaterally. The esophagus has normal
imaging features.

Lungs/Pleura: Centrilobular emphsyema noted. Previously identified
bilateral pulmonary nodules are stable. No new suspicious pulmonary
nodule or mass. Small focus of ground-glass opacity identified
anterior left upper lobe measuring up to 2.3 cm, potentially
infectious/inflammatory. No focal airspace consolidation. No pleural
effusion.

Upper Abdomen: The liver shows diffusely decreased attenuation
suggesting fat deposition. Areas of sparing noted along the
falciform ligament.

Musculoskeletal: No worrisome lytic or sclerotic osseous
abnormality.
IMPRESSION: 1. Lung-RADS 3, probably benign findings. New area of ground-glass
opacity in the anterior left upper lobe, potentially
infectious/inflammatory. Short-term follow-up in 6 months is
recommended with repeat low-dose chest CT without contrast (please
use the following order, "CT CHEST LCS NODULE FOLLOW-UP W/O CM").
2. Hepatic steatosis.
3. Aortic Atherosclerosis (NAOFU-H73.3) and Emphysema (NAOFU-FHX.Q).

## 2022-06-02 ENCOUNTER — Other Ambulatory Visit: Payer: Self-pay | Admitting: *Deleted

## 2022-06-02 DIAGNOSIS — Z122 Encounter for screening for malignant neoplasm of respiratory organs: Secondary | ICD-10-CM

## 2022-06-02 DIAGNOSIS — F17210 Nicotine dependence, cigarettes, uncomplicated: Secondary | ICD-10-CM

## 2022-06-02 DIAGNOSIS — Z87891 Personal history of nicotine dependence: Secondary | ICD-10-CM

## 2022-06-15 ENCOUNTER — Ambulatory Visit
Admission: RE | Admit: 2022-06-15 | Discharge: 2022-06-15 | Disposition: A | Discharge status: Home / Self Care | Payer: Medicaid Other | Source: Ambulatory Visit | Attending: Acute Care | Admitting: Acute Care

## 2022-06-15 DIAGNOSIS — J439 Emphysema, unspecified: Secondary | ICD-10-CM | POA: Insufficient documentation

## 2022-06-15 DIAGNOSIS — K5730 Diverticulosis of large intestine without perforation or abscess without bleeding: Secondary | ICD-10-CM | POA: Insufficient documentation

## 2022-06-15 DIAGNOSIS — I700 Atherosclerosis of aorta: Secondary | ICD-10-CM | POA: Diagnosis not present

## 2022-06-15 DIAGNOSIS — K760 Fatty (change of) liver, not elsewhere classified: Secondary | ICD-10-CM | POA: Insufficient documentation

## 2022-06-15 DIAGNOSIS — F17210 Nicotine dependence, cigarettes, uncomplicated: Secondary | ICD-10-CM | POA: Diagnosis not present

## 2022-06-15 DIAGNOSIS — Z122 Encounter for screening for malignant neoplasm of respiratory organs: Secondary | ICD-10-CM

## 2022-06-15 DIAGNOSIS — Z87891 Personal history of nicotine dependence: Secondary | ICD-10-CM

## 2022-06-16 ENCOUNTER — Other Ambulatory Visit: Payer: Self-pay | Admitting: Acute Care

## 2022-06-16 DIAGNOSIS — F17210 Nicotine dependence, cigarettes, uncomplicated: Secondary | ICD-10-CM

## 2022-06-16 DIAGNOSIS — Z87891 Personal history of nicotine dependence: Secondary | ICD-10-CM

## 2022-06-16 DIAGNOSIS — Z122 Encounter for screening for malignant neoplasm of respiratory organs: Secondary | ICD-10-CM

## 2022-11-29 IMAGING — CT CT CHEST LCS NODULE FOLLOW-UP W/O CM
2 of 5 series · 14 of 40 positions shown, 17 images · non-contrast
Comparison: Low-dose lung cancer screening chest CT 10/14/2020.

CLINICAL DATA: 64-year-old male current smoker with 97 pack-year
history of smoking. Follow-up for prior abnormal low-dose lung
cancer screening examination.

EXAM:
CT CHEST WITHOUT CONTRAST FOR LUNG CANCER SCREENING NODULE FOLLOW-UP
TECHNIQUE: Multidetector CT imaging of the chest was performed following the
standard protocol without IV contrast.

[Series 3: lung lcs f/u 1.00 · axial · 0.80mm/px · z∈[-1244,-938]mm · 11 of 340 slices shown, 14 images]
[im 17/340  mediastinal]
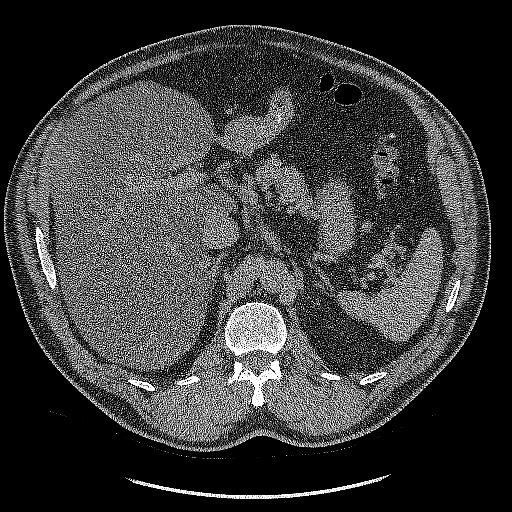
[im 17/340  lung]
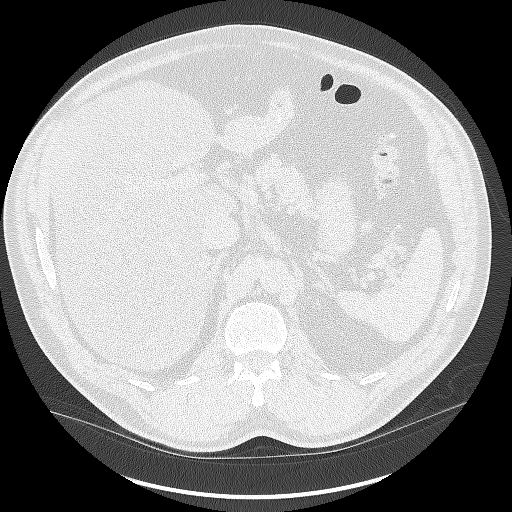
[im 49/340  lung]
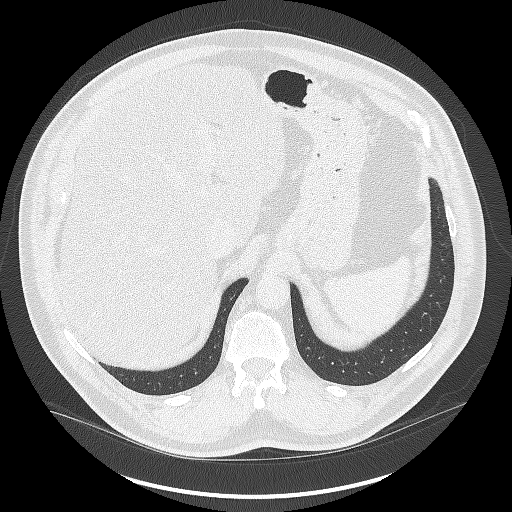
[im 81/340  lung]
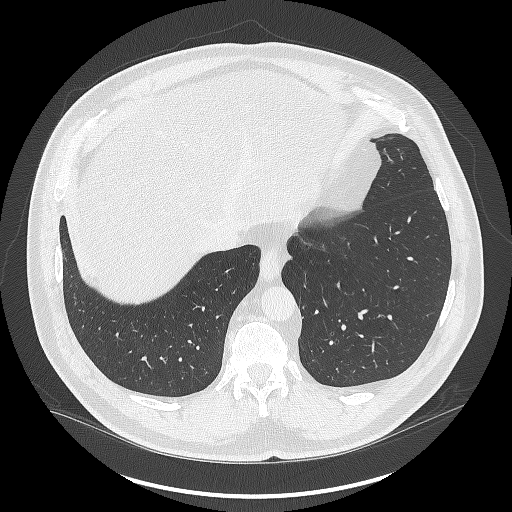
[im 114/340  lung]
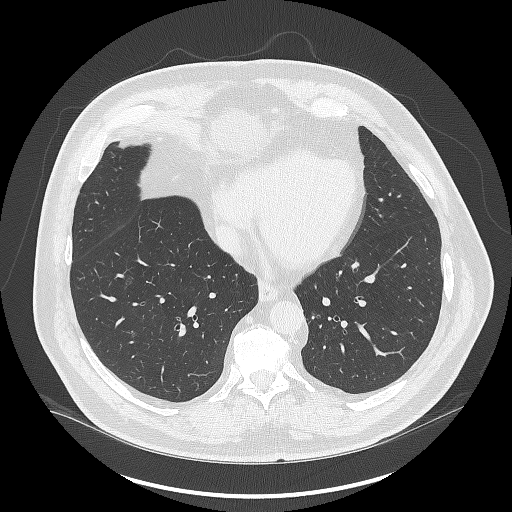
[im 146/340  mediastinal]
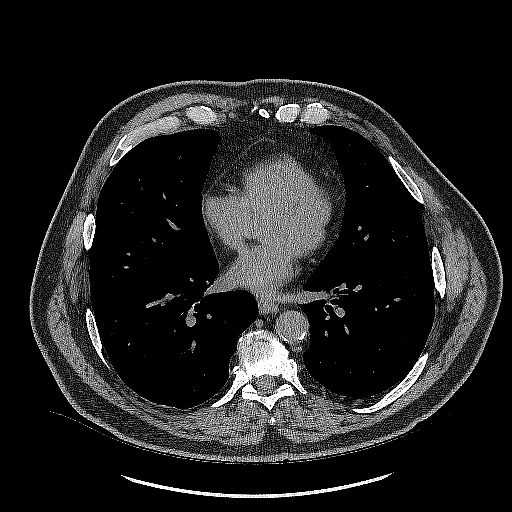
[im 146/340  lung]
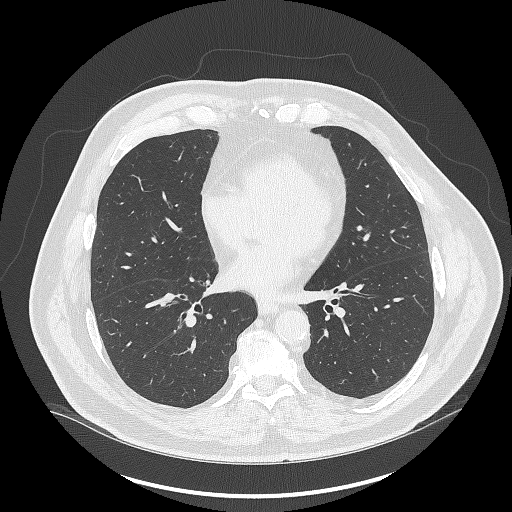
[im 178/340  lung]
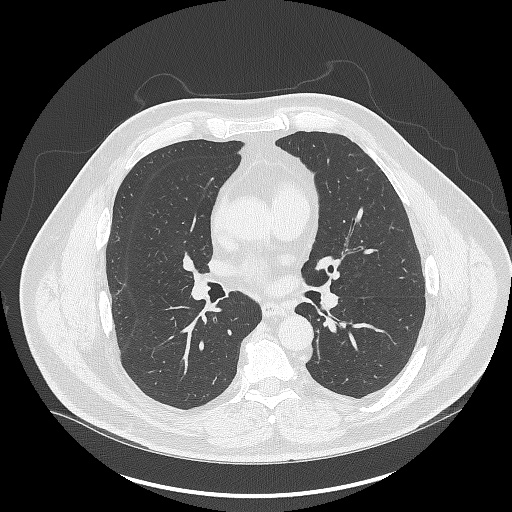
[im 194/340  lung]
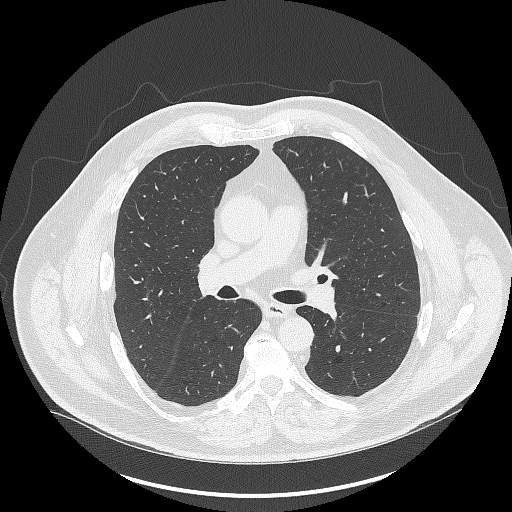
[im 227/340  lung]
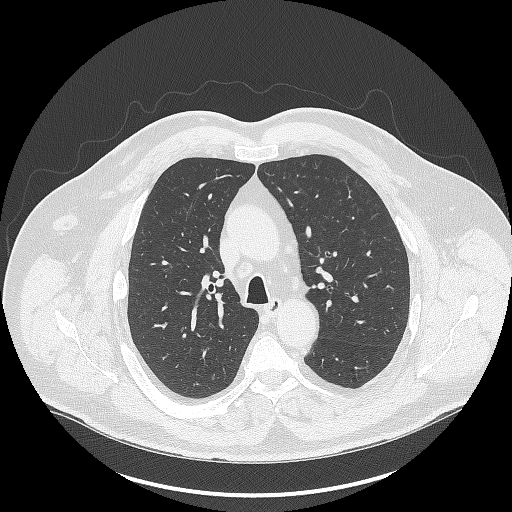
[im 259/340  mediastinal]
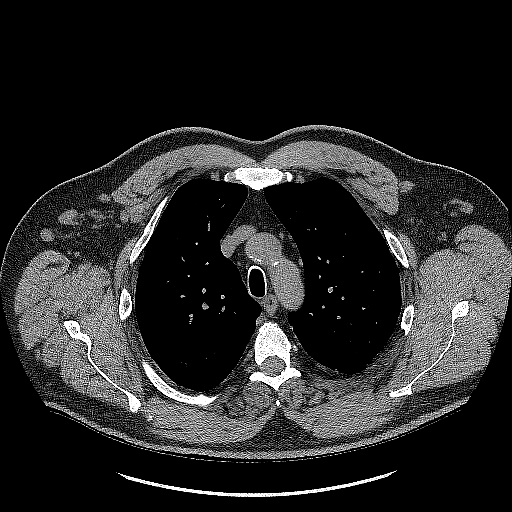
[im 259/340  lung]
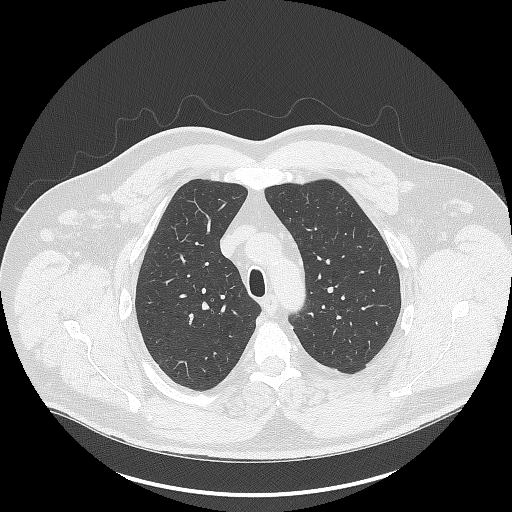
[im 291/340  lung]
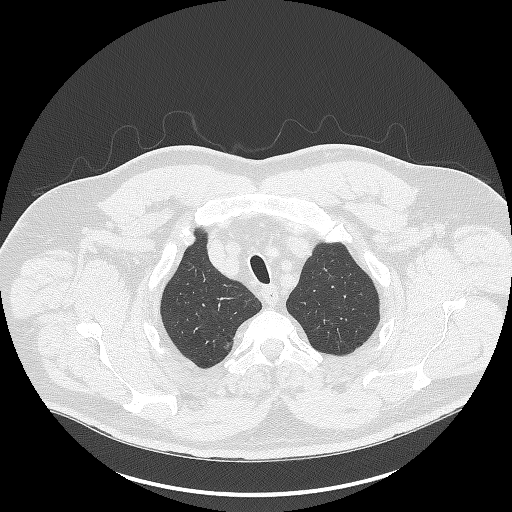
[im 323/340  lung]
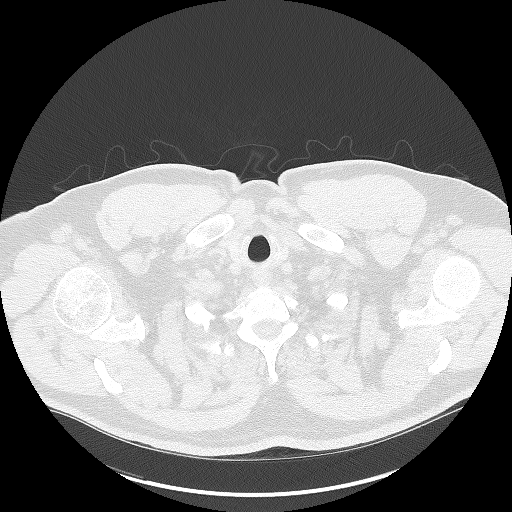

[Series 5: lcs f/u 1.00 cor · coronal · 0.66mm/px · 3 of 378 slices shown]
[im 76/378  lung]
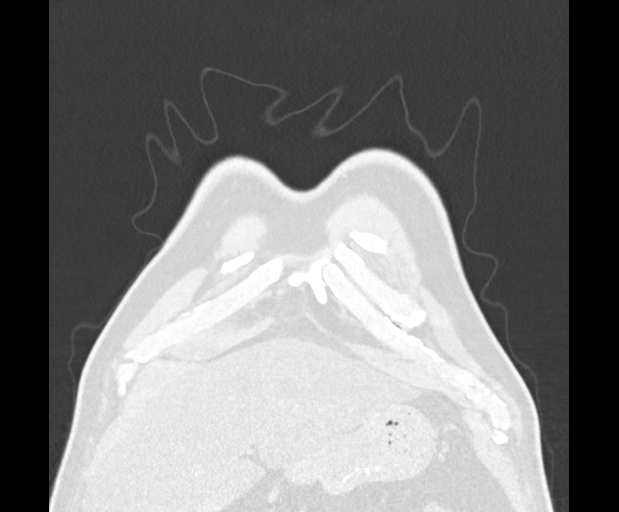
[im 151/378  lung]
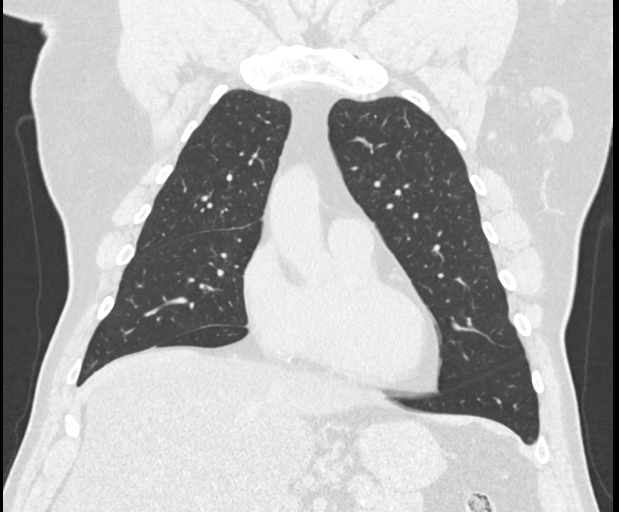
[im 227/378  lung]
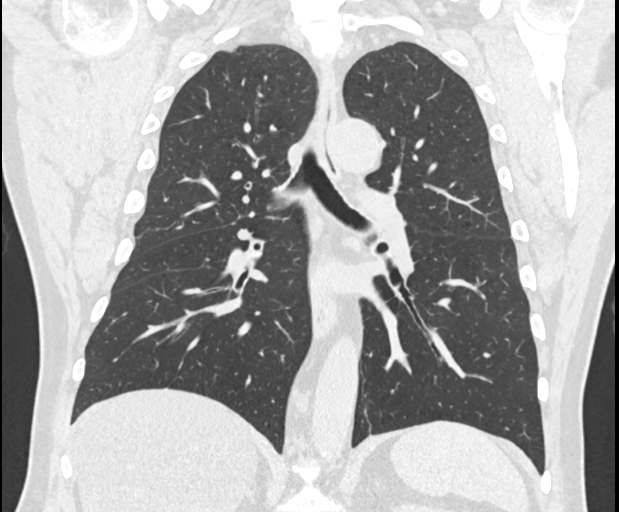

[14 of 40 positions shown; findings below may reference images not displayed]

FINDINGS: Cardiovascular: Heart size is normal. There is no significant
pericardial fluid, thickening or pericardial calcification. There is
aortic atherosclerosis, as well as atherosclerosis of the great
vessels of the mediastinum and the coronary arteries, including
calcified atherosclerotic plaque in the left main, left anterior
descending, left circumflex and right coronary arteries.

Mediastinum/Nodes: No pathologically enlarged mediastinal or hilar
lymph nodes. Please note that accurate exclusion of hilar adenopathy
is limited on noncontrast CT scans. Esophagus is unremarkable in
appearance. No axillary lymphadenopathy.

Lungs/Pleura: Previously noted area of ground-glass attenuation in
the anterior aspect of the left upper lobe has completely resolved,
indicative of a benign etiology on the prior study. There continues
to be a few scattered small pulmonary nodules in the lungs, similar
to the prior study, largest of which is in the left upper lobe
(axial image 197 of series 3), with a volume derived mean diameter
of 4.0 mm. No other larger more suspicious appearing pulmonary
nodules or masses are noted. No acute consolidative airspace
disease. No pleural effusions. Diffuse bronchial wall thickening
with mild centrilobular and paraseptal emphysema.

Upper Abdomen: Severe diffuse low attenuation throughout the
visualized hepatic parenchyma, indicative of severe hepatic
steatosis.

Musculoskeletal: There are no aggressive appearing lytic or blastic
lesions noted in the visualized portions of the skeleton.
IMPRESSION: 1. Lung-RADS 2S, benign appearance or behavior. Continue annual
screening with low-dose chest CT without contrast in 12 months.
2. The "S" modifier above refers to potentially clinically
significant non lung cancer related findings. Specifically, there is
aortic atherosclerosis, in addition to left main and 3 vessel
coronary artery disease. Please note that although the presence of
coronary artery calcium documents the presence of coronary artery
disease, the severity of this disease and any potential stenosis
cannot be assessed on this non-gated CT examination. Assessment for
potential risk factor modification, dietary therapy or pharmacologic
therapy may be warranted, if clinically indicated.
3. Mild diffuse bronchial wall thickening with mild centrilobular
and paraseptal emphysema; imaging findings suggestive of underlying
COPD.

Aortic Atherosclerosis (19A1S-6MR.R) and Emphysema (19A1S-MGT.Q).

## 2023-03-03 ENCOUNTER — Other Ambulatory Visit: Payer: Self-pay | Admitting: *Deleted

## 2023-03-03 DIAGNOSIS — Z87891 Personal history of nicotine dependence: Secondary | ICD-10-CM

## 2023-03-03 DIAGNOSIS — Z122 Encounter for screening for malignant neoplasm of respiratory organs: Secondary | ICD-10-CM

## 2023-03-03 DIAGNOSIS — F17210 Nicotine dependence, cigarettes, uncomplicated: Secondary | ICD-10-CM

## 2023-06-19 ENCOUNTER — Ambulatory Visit
Admission: RE | Admit: 2023-06-19 | Discharge: 2023-06-19 | Disposition: A | Discharge status: Home / Self Care | Payer: Medicare Other | Source: Ambulatory Visit | Attending: Acute Care | Admitting: Acute Care

## 2023-06-19 DIAGNOSIS — Z87891 Personal history of nicotine dependence: Secondary | ICD-10-CM | POA: Diagnosis present

## 2023-06-19 DIAGNOSIS — F17210 Nicotine dependence, cigarettes, uncomplicated: Secondary | ICD-10-CM | POA: Insufficient documentation

## 2023-06-19 DIAGNOSIS — Z122 Encounter for screening for malignant neoplasm of respiratory organs: Secondary | ICD-10-CM | POA: Insufficient documentation

## 2023-06-21 ENCOUNTER — Other Ambulatory Visit: Payer: Self-pay

## 2023-06-21 DIAGNOSIS — Z87891 Personal history of nicotine dependence: Secondary | ICD-10-CM

## 2023-06-21 DIAGNOSIS — Z122 Encounter for screening for malignant neoplasm of respiratory organs: Secondary | ICD-10-CM

## 2023-06-21 DIAGNOSIS — F17210 Nicotine dependence, cigarettes, uncomplicated: Secondary | ICD-10-CM

## 2024-06-19 ENCOUNTER — Ambulatory Visit
Admission: RE | Admit: 2024-06-19 | Discharge: 2024-06-19 | Disposition: A | Discharge status: Home / Self Care | Source: Ambulatory Visit | Attending: Acute Care | Admitting: Acute Care

## 2024-06-19 DIAGNOSIS — Z87891 Personal history of nicotine dependence: Secondary | ICD-10-CM | POA: Insufficient documentation

## 2024-06-19 DIAGNOSIS — F17210 Nicotine dependence, cigarettes, uncomplicated: Secondary | ICD-10-CM | POA: Diagnosis present

## 2024-06-19 DIAGNOSIS — Z122 Encounter for screening for malignant neoplasm of respiratory organs: Secondary | ICD-10-CM | POA: Insufficient documentation

## 2024-07-01 ENCOUNTER — Other Ambulatory Visit: Payer: Self-pay | Admitting: Acute Care

## 2024-07-01 DIAGNOSIS — Z122 Encounter for screening for malignant neoplasm of respiratory organs: Secondary | ICD-10-CM

## 2024-07-01 DIAGNOSIS — Z87891 Personal history of nicotine dependence: Secondary | ICD-10-CM

## 2024-07-01 DIAGNOSIS — F17210 Nicotine dependence, cigarettes, uncomplicated: Secondary | ICD-10-CM
